# Patient Record
Sex: Female | Born: 1975 | Race: Black or African American | Hispanic: No | Marital: Single | Smoking: Former smoker
Health system: Southern US, Community
[De-identification: ages and names within clinical notes are randomized; demographics above are authoritative.]

## PROBLEM LIST (undated history)

## (undated) HISTORY — PX: BREAST ENHANCEMENT SURGERY: SHX7

---

## 2005-01-31 ENCOUNTER — Ambulatory Visit: Payer: Self-pay | Admitting: Family Medicine

## 2005-02-21 ENCOUNTER — Ambulatory Visit: Payer: Self-pay | Admitting: *Deleted

## 2005-02-26 ENCOUNTER — Ambulatory Visit (HOSPITAL_COMMUNITY): Admission: RE | Admit: 2005-02-26 | Discharge: 2005-02-26 | Payer: Self-pay | Admitting: *Deleted

## 2005-03-22 ENCOUNTER — Ambulatory Visit: Payer: Self-pay | Admitting: Obstetrics & Gynecology

## 2006-10-07 HISTORY — PX: AUGMENTATION MAMMAPLASTY: SUR837

## 2010-01-21 ENCOUNTER — Emergency Department: Payer: Self-pay | Admitting: Emergency Medicine

## 2010-08-09 ENCOUNTER — Emergency Department: Payer: Self-pay | Admitting: Emergency Medicine

## 2010-12-04 ENCOUNTER — Ambulatory Visit: Payer: Self-pay | Admitting: Specialist

## 2011-02-08 ENCOUNTER — Ambulatory Visit: Payer: Self-pay | Admitting: Neurology

## 2011-07-11 ENCOUNTER — Emergency Department: Payer: Self-pay | Admitting: Internal Medicine

## 2016-09-04 ENCOUNTER — Encounter (HOSPITAL_COMMUNITY): Payer: Self-pay | Admitting: Emergency Medicine

## 2016-09-04 ENCOUNTER — Emergency Department (HOSPITAL_COMMUNITY)
Admission: EM | Admit: 2016-09-04 | Discharge: 2016-09-04 | Disposition: A | Discharge status: Home / Self Care | Payer: Self-pay | Attending: Emergency Medicine | Admitting: Emergency Medicine

## 2016-09-04 DIAGNOSIS — M5442 Lumbago with sciatica, left side: Secondary | ICD-10-CM | POA: Insufficient documentation

## 2016-09-04 MED ORDER — LIDOCAINE 5 % EX PTCH: 1.0000 | MEDICATED_PATCH | CUTANEOUS | 0 refills | Status: DC

## 2016-09-04 MED ORDER — MELOXICAM 7.5 MG PO TABS: 7.5000 mg | ORAL_TABLET | Freq: Every day | ORAL | 0 refills | Status: AC

## 2016-09-04 MED ORDER — CYCLOBENZAPRINE HCL 10 MG PO TABS: 10.0000 mg | ORAL_TABLET | Freq: Three times a day (TID) | ORAL | 0 refills | Status: DC | PRN

## 2016-09-04 NOTE — Discharge Instructions (Signed)
Read the information below.  Use the prescribed medication as directed.  Please discuss all new medications with your pharmacist.  You may return to the Emergency Department at any time for worsening condition or any new symptoms that concern you.     If you develop fevers, loss of control of bowel or bladder, weakness or numbness in your legs, or are unable to walk, return to the ER for a recheck.   Do not take other NSAIDs (e.g. Motrin, aleve, ibuprofen, Advil) while taking Meloxicam (Mobic).

## 2016-09-04 NOTE — ED Triage Notes (Signed)
Patient reports lower back pain radiating down left leg x4 days. Denies numbness, tingling, and bowel or bladder changes. Ambulatory to triage.

## 2016-09-04 NOTE — ED Provider Notes (Signed)
[facility]-EMERGENCY DEPT Provider Note   CSN: [id number] Arrival date & time: 09/04/16  2039     History   Chief Complaint Chief Complaint  Patient presents with  . Back Pain    HPI Jacqueline Horn is a 40 y.o. female.  HPI   Patient presents with left lower back pain that began 3 days ago.  The pain initially involved her entire lower back.  The next day she had pain only on the left side.  It is an intense pain that occasionally shoots down the back of her left leg to the level of her knee.  She does do lifting and bending at work [employer] but denies any specific injury.  Took motrin for pain with temporary relief.  Denies fevers, chills, abdominal pain, loss of control of bowel or bladder, weakness of numbness of the extremities, saddle anesthesia, bowel, urinary, or vaginal complaints.     History reviewed. No pertinent past medical history.  There are no active problems to display for this patient.   History reviewed. No pertinent surgical history.  OB History    No data available       Home Medications    Prior to Admission medications   Medication Sig Start Date End Date Taking? Authorizing Provider  cyclobenzaprine (FLEXERIL) 10 MG tablet Take 1 tablet (10 mg total) by mouth 3 (three) times daily as needed for muscle spasms (and pain). 09/04/16   Trixie DredgeEmily Taygen Newsome, PA-C  lidocaine (LIDODERM) 5 % Place 1 patch onto the skin daily. Remove & Discard patch within 12 hours or as directed by MD 09/04/16   Trixie DredgeEmily Dashton Czerwinski, PA-C  meloxicam (MOBIC) 7.5 MG tablet Take 1 tablet (7.5 mg total) by mouth daily. 09/04/16   Trixie DredgeEmily Miho Monda, PA-C    Family History History reviewed. No pertinent family history.  Social History Social History  Substance Use Topics  . Smoking status: Never Smoker  . Smokeless tobacco: Never Used  . Alcohol use Not on file     Allergies   Patient has no known allergies.   Review of Systems Review of Systems  Constitutional: Negative for fever.    Gastrointestinal: Negative for abdominal pain, diarrhea, nausea and vomiting.  Genitourinary: Negative for dysuria, frequency, urgency, vaginal bleeding and vaginal discharge.  Musculoskeletal: Positive for back pain and gait problem.  Skin: Negative for rash.  Allergic/Immunologic: Negative for immunocompromised state.  Neurological: Negative for weakness and numbness.  Psychiatric/Behavioral: Negative for self-injury.     Physical Exam Updated Vital Signs BP 138/98 (BP Location: Left Arm)   Pulse 97   Temp 98.6 F (37 C) (Oral)   Resp 18   Ht 5\' 7"  (1.702 m)   Wt 68 kg   LMP 09/04/2016   SpO2 95%   BMI 23.49 kg/m   Physical Exam  Constitutional: She appears well-developed and well-nourished. No distress.  HENT:  Head: Normocephalic and atraumatic.  Neck: Neck supple.  Pulmonary/Chest: Effort normal.  Abdominal: Soft. She exhibits no distension and no mass. There is no tenderness. There is no rebound and no guarding.  Musculoskeletal:       Back:  Spine nontender, no crepitus, or stepoffs. Lower extremities:  Strength 5/5, sensation intact, distal pulses intact.     Neurological: She is alert.  Skin: She is not diaphoretic.  Nursing note and vitals reviewed.    ED Treatments / Results  Labs (all labs ordered are listed, but only abnormal results are displayed) Labs Reviewed - No data to display  EKG  EKG Interpretation None       Radiology No results found.  Procedures Procedures (including critical care time)  Medications Ordered in ED Medications - No data to display   Initial Impression / Assessment and Plan / ED Course  I have reviewed the triage vital signs and the nursing notes.  Pertinent labs & imaging results that were available during my care of the patient were reviewed by me and considered in my medical decision making (see chart for details).  Clinical Course    Afebrile, nontoxic patient with atraumatic low back pain with  sciatica. No red flags. D/C home with symptomatic medications.  PCP follow up.  Discussed result, findings, treatment, and follow up  with patient.  Pt given return precautions.  Pt verbalizes understanding and agrees with plan.      Final Clinical Impressions(s) / ED Diagnoses   Final diagnoses:  Acute left-sided low back pain with left-sided sciatica    New Prescriptions Discharge Medication List as of 09/04/2016  9:32 PM    START taking these medications   Details  cyclobenzaprine (FLEXERIL) 10 MG tablet Take 1 tablet (10 mg total) by mouth 3 (three) times daily as needed for muscle spasms (and pain)., Starting Wed 09/04/2016, Print    lidocaine (LIDODERM) 5 % Place 1 patch onto the skin daily. Remove & Discard patch within 12 hours or as directed by MD, Starting Wed 09/04/2016, Print    meloxicam (MOBIC) 7.5 MG tablet Take 1 tablet (7.5 mg total) by mouth daily., Starting Wed 09/04/2016, Print         [place]neau BeachEmily Jovon Streetman, PA-C 09/04/16 2222    Benjiman CoreNathan Pickering, MD 09/04/16 [phone]

## 2016-09-25 ENCOUNTER — Encounter (HOSPITAL_COMMUNITY): Payer: Self-pay | Admitting: Emergency Medicine

## 2016-09-25 ENCOUNTER — Emergency Department (HOSPITAL_COMMUNITY): Payer: Self-pay

## 2016-09-25 ENCOUNTER — Emergency Department (HOSPITAL_COMMUNITY)
Admission: EM | Admit: 2016-09-25 | Discharge: 2016-09-25 | Disposition: A | Discharge status: Home / Self Care | Payer: Self-pay | Attending: Emergency Medicine | Admitting: Emergency Medicine

## 2016-09-25 DIAGNOSIS — Y9389 Activity, other specified: Secondary | ICD-10-CM | POA: Insufficient documentation

## 2016-09-25 DIAGNOSIS — M545 Low back pain, unspecified: Secondary | ICD-10-CM

## 2016-09-25 DIAGNOSIS — Z79899 Other long term (current) drug therapy: Secondary | ICD-10-CM | POA: Insufficient documentation

## 2016-09-25 DIAGNOSIS — X500XXA Overexertion from strenuous movement or load, initial encounter: Secondary | ICD-10-CM | POA: Insufficient documentation

## 2016-09-25 DIAGNOSIS — Y990 Civilian activity done for income or pay: Secondary | ICD-10-CM | POA: Insufficient documentation

## 2016-09-25 DIAGNOSIS — Y929 Unspecified place or not applicable: Secondary | ICD-10-CM | POA: Insufficient documentation

## 2016-09-25 LAB — POC URINE PREG, ED: Preg Test, Ur: NEGATIVE

## 2016-09-25 MED ORDER — IBUPROFEN 200 MG PO TABS
600.0000 mg | ORAL_TABLET | Freq: Once | ORAL | Status: AC
  Administered 2016-09-25: 600 mg via ORAL
  Filled 2016-09-25: qty 3

## 2016-09-25 MED ORDER — METHOCARBAMOL 500 MG PO TABS: 500.0000 mg | ORAL_TABLET | Freq: Two times a day (BID) | ORAL | 0 refills | Status: DC

## 2016-09-25 MED ORDER — TRAMADOL HCL 50 MG PO TABS: 50.0000 mg | ORAL_TABLET | Freq: Four times a day (QID) | ORAL | 0 refills | Status: DC | PRN

## 2016-09-25 MED ORDER — NAPROXEN 500 MG PO TABS: 500.0000 mg | ORAL_TABLET | Freq: Two times a day (BID) | ORAL | 0 refills | Status: DC

## 2016-09-25 NOTE — ED Provider Notes (Signed)
[facility]-EMERGENCY DEPT Provider Note   CSN: [id number] Arrival date & time: 09/25/16  1501  By signing my name below, I, Teofilo PodMatthew P. Jamison, attest that this documentation has been prepared under the direction and in the presence of Melburn HakeNicole Demetrious Rainford, [place]-C. Electronically Signed: Teofilo PodMatthew P. Jamison, ED Scribe. 09/25/2016. 3:50 PM.   History   Chief Complaint No chief complaint on file.   The history is provided by the patient. No language interpreter was used.   HPI Comments:  Jacqueline Horn is a 40 y.o. female who presents to the Emergency Department complaining of constant back pain x 3 weeks. Pt reports that she initially injured her back bending and lifting heavy objects repetitively at work. Pt states that she has been limping since sustaining an injury. Pt was seen on 09/04/16 for the back injury with pain radiating down her left leg, and pt was told that she cannot return to work because her pain has persisted. Pt states that the pain is exacerbated by sitting, standing and bending, but is alleviated by walking. She reports her back pain no longer radiates down her leg but just stays localized to her back. Pt states that her work is requiring her to get reevaluated with xray. Pt took pain medication at the ED with significant relief, and has been taking Advil since with mild relief. Pt denies hx of cancer, hx if IV drug use, and denies any other recent injury/trauma. Denies fever, chest pain, SOB, vomiting, bladder incontinence, numbness, weakness. Denies any other recent fall or injuries.  History reviewed. No pertinent past medical history.  There are no active problems to display for this patient.   History reviewed. No pertinent surgical history.  OB History    No data available       Home Medications    Prior to Admission medications   Medication Sig Start Date End Date Taking? Authorizing Provider  cyclobenzaprine (FLEXERIL) 10 MG tablet Take 1 tablet (10 mg total) by  mouth 3 (three) times daily as needed for muscle spasms (and pain). 09/04/16   Trixie DredgeEmily West, PA-C  lidocaine (LIDODERM) 5 % Place 1 patch onto the skin daily. Remove & Discard patch within 12 hours or as directed by MD 09/04/16   Trixie DredgeEmily West, PA-C  meloxicam (MOBIC) 7.5 MG tablet Take 1 tablet (7.5 mg total) by mouth daily. 09/04/16   Trixie DredgeEmily West, PA-C  methocarbamol (ROBAXIN) 500 MG tablet Take 1 tablet (500 mg total) by mouth 2 (two) times daily. 09/25/16   Barrett HenleNicole Elizabeth Sayyid Harewood, PA-C  naproxen (NAPROSYN) 500 MG tablet Take 1 tablet (500 mg total) by mouth 2 (two) times daily. 09/25/16   Barrett HenleNicole Elizabeth Stephani Janak, PA-C  traMADol (ULTRAM) 50 MG tablet Take 1 tablet (50 mg total) by mouth every 6 (six) hours as needed. 09/25/16   Barrett HenleNicole Elizabeth Jenissa Tyrell, PA-C    Family History No family history on file.  Social History Social History  Substance Use Topics  . Smoking status: Never Smoker  . Smokeless tobacco: Never Used  . Alcohol use Not on file     Allergies   Patient has no known allergies.   Review of Systems Review of Systems  Constitutional: Negative for fever.  Respiratory: Negative for shortness of breath.   Cardiovascular: Negative for chest pain.  Gastrointestinal: Negative for vomiting.  Musculoskeletal: Positive for back pain.  Neurological: Negative for weakness and numbness.     Physical Exam Updated Vital Signs BP 116/79 (BP Location: Left Leg)   Pulse 66  Temp 98.1 F (36.7 C) (Oral)   Resp 18   LMP 09/04/2016 Comment: preg test waiver signed  SpO2 100%   Physical Exam  Constitutional: She is oriented to person, place, and time. She appears well-developed and well-nourished.  HENT:  Head: Normocephalic and atraumatic.  Eyes: Conjunctivae and EOM are normal. Right eye exhibits no discharge. Left eye exhibits no discharge. No scleral icterus.  Neck: Normal range of motion. Neck supple.  Cardiovascular: Normal rate, regular rhythm, normal heart sounds and  intact distal pulses.   Pulmonary/Chest: Effort normal. No respiratory distress. She has no wheezes. She has no rales. She exhibits no tenderness.  Abdominal: Soft. Bowel sounds are normal. There is no tenderness.  Musculoskeletal: Normal range of motion. She exhibits no edema, tenderness or deformity.  No midline C, T tenderness. Midline lumbar and left paraspinal muscles. Full range of motion of neck and back. Full range of motion of bilateral upper and lower extremities, with 5/5 strength. Sensation intact. 2+ radial and PT pulses. Cap refill <2 seconds. Patient able to stand and ambulate without assistance.    Neurological: She is alert and oriented to person, place, and time. She has normal strength. She displays normal reflexes. No sensory deficit.  Skin: Skin is warm and dry.  Nursing note and vitals reviewed.    ED Treatments / Results  DIAGNOSTIC STUDIES:  Oxygen Saturation is 100% on RA, normal by my interpretation.    COORDINATION OF CARE:  3:50 PM Discussed treatment plan with pt at bedside and pt agreed to plan.   Labs (all labs ordered are listed, but only abnormal results are displayed) Labs Reviewed  POC URINE PREG, ED    EKG  EKG Interpretation None       Radiology Dg Lumbar Spine Complete  Result Date: 09/25/2016 CLINICAL DATA:  Low back pain radiating down LEFT leg for 3 weeks, repetitive movement work bending down to stop child's EXAM: LUMBAR SPINE - COMPLETE 4+ VIEW COMPARISON:  None FINDINGS: Five non-rib-bearing lumbar vertebra. Bones appear mildly demineralized. Vertebral body and disc space heights maintained. No acute fracture, subluxation or bone destruction. No spondylolysis. Areas of sclerosis within LEFT iliac bone, small focus on RIGHT as well, nonspecific and of uncertain etiology. SI joints symmetric. IMPRESSION: No acute osseous abnormalities. 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  Significance of these is uncertain ; does patient have a history of prior pelvic trauma? Correlation with patient history recommended. If patient has any prior outside exams these would be of benefit in establishing stability of these findings. In the absence of prior studies, consider followup radionuclide bone scan determine if these represent metabolically active sites of potential pathology. Electronically Signed   By: Ulyses Southward M.D.   On: 09/25/2016 17:51    Procedures Procedures (including critical care time)  Medications Ordered in ED Medications  ibuprofen (ADVIL,MOTRIN) tablet 600 mg (600 mg Oral Given 09/25/16 1612)     Initial Impression / Assessment and Plan / ED Course  I have reviewed the triage vital signs and the nursing notes.  Pertinent labs & imaging results that were available during my care of the patient were reviewed by me and considered in my medical decision making (see chart for details).  Clinical Course     Patient with back pain for the past 2-3 weeks after lifting repetitively at work. Pt's work's is requiring her to get re-evaluated prior to returning. Reports back pain has mildly  improved. VSS. Exam revealed TTP of lumbar midline and left lumbar paraspinal muscles.  No neurological deficits and normal neuro exam.  Patient can walk but states is painful.  No loss of bowel or bladder control.  No concern for cauda equina.  No fever, night sweats, weight loss, h/o cancer, IVDU.  Lumbar spine xray showed areas of sclerosis within iliac bones bilaterally, L>R. Pt denies hx of pelvic trauma. No prior films to compare findings. Discussed findings with pt. Advised her to follow up with PCP for further evaluation of findings. Pt given print out of xray results at d/c.  RICE protocol and pain medicine indicated and discussed with patient regarding back pain.  Discussed return precautions.  Final Clinical Impressions(s) / ED Diagnoses   Final diagnoses:  Acute midline  low back pain without sciatica    New Prescriptions Discharge Medication List as of 09/25/2016  6:02 PM    START taking these medications   Details  methocarbamol (ROBAXIN) 500 MG tablet Take 1 tablet (500 mg total) by mouth 2 (two) times daily., Starting Wed 09/25/2016, Print    naproxen (NAPROSYN) 500 MG tablet Take 1 tablet (500 mg total) by mouth 2 (two) times daily., Starting Wed 09/25/2016, Print    traMADol (ULTRAM) 50 MG tablet Take 1 tablet (50 mg total) by mouth every 6 (six) hours as needed., Starting Wed 09/25/2016, Print      I personally performed the services described in this documentation, which was scribed in my presence. The recorded information has been reviewed and is accurate.     Satira Sarkicole Elizabeth West Salem[place]adeau, [place]-C 09/26/16 [phone]    Lorre NickAnthony Allen, MD 09/29/16 [phone]

## 2016-09-25 NOTE — ED Triage Notes (Signed)
Pt reports hurting back at work 3 weeks ago. Pt has not been working since and does not want to go back until she knows she is not permanently injured. States her work is requiring her to get reevaluated with X-rays.

## 2016-09-25 NOTE — Discharge Instructions (Signed)
Take your medications as prescribed as needed for pain relief. I also recommend applying ice and/or heat to affected area for 15-20 minutes 3-4 times daily for additional pain relief. I recommend refrain from doing any heavy lifting, squatting or repetitive movements that exacerbate her symptoms for the next few days. I recommend following up with a primary care provider office listed below if her symptoms have not improved in the next week. I also recommend scheduling a follow-up appointment regarding further evaluation of your new x-ray findings regarding your SI joint. Please return to the Emergency Department if symptoms worsen or new onset of fever, numbness, tingling, groin anesthesia, loss of bowel or bladder, weakness.

## 2016-10-10 ENCOUNTER — Encounter (HOSPITAL_COMMUNITY): Payer: Self-pay | Admitting: Emergency Medicine

## 2016-10-10 ENCOUNTER — Emergency Department (HOSPITAL_COMMUNITY)
Admission: EM | Admit: 2016-10-10 | Discharge: 2016-10-11 | Disposition: A | Discharge status: Other Acute Inpatient Hospital | Payer: Federal, State, Local not specified - Other | Attending: Emergency Medicine | Admitting: Emergency Medicine

## 2016-10-10 DIAGNOSIS — F322 Major depressive disorder, single episode, severe without psychotic features: Secondary | ICD-10-CM | POA: Diagnosis present

## 2016-10-10 DIAGNOSIS — Z87891 Personal history of nicotine dependence: Secondary | ICD-10-CM | POA: Insufficient documentation

## 2016-10-10 DIAGNOSIS — Z79899 Other long term (current) drug therapy: Secondary | ICD-10-CM | POA: Insufficient documentation

## 2016-10-10 DIAGNOSIS — F1210 Cannabis abuse, uncomplicated: Secondary | ICD-10-CM

## 2016-10-10 DIAGNOSIS — Z91040 Latex allergy status: Secondary | ICD-10-CM | POA: Insufficient documentation

## 2016-10-10 LAB — CBC WITH DIFFERENTIAL/PLATELET
BASOS ABS: 0 10*3/uL (ref 0.0–0.1)
BASOS PCT: 0 %
Eosinophils Absolute: 0.1 10*3/uL (ref 0.0–0.7)
Eosinophils Relative: 0 %
HEMATOCRIT: 36 % (ref 36.0–46.0)
Hemoglobin: 12.2 g/dL (ref 12.0–15.0)
LYMPHS PCT: 20 %
Lymphs Abs: 2.6 10*3/uL (ref 0.7–4.0)
MCH: 27.4 pg (ref 26.0–34.0)
MCHC: 33.9 g/dL (ref 30.0–36.0)
MCV: 80.9 fL (ref 78.0–100.0)
Monocytes Absolute: 0.9 10*3/uL (ref 0.1–1.0)
Monocytes Relative: 7 %
NEUTROS ABS: 9.3 10*3/uL — AB (ref 1.7–7.7)
NEUTROS PCT: 73 %
Platelets: 353 10*3/uL (ref 150–400)
RBC: 4.45 MIL/uL (ref 3.87–5.11)
RDW: 15.8 % — ABNORMAL HIGH (ref 11.5–15.5)
WBC: 12.9 10*3/uL — AB (ref 4.0–10.5)

## 2016-10-10 LAB — COMPREHENSIVE METABOLIC PANEL
ALT: 67 U/L — ABNORMAL HIGH (ref 14–54)
ANION GAP: 17 — AB (ref 5–15)
AST: 151 U/L — AB (ref 15–41)
Albumin: 4.3 g/dL (ref 3.5–5.0)
Alkaline Phosphatase: 79 U/L (ref 38–126)
BUN: 10 mg/dL (ref 6–20)
CHLORIDE: 99 mmol/L — AB (ref 101–111)
CO2: 19 mmol/L — ABNORMAL LOW (ref 22–32)
Calcium: 8.9 mg/dL (ref 8.9–10.3)
Creatinine, Ser: 0.76 mg/dL (ref 0.44–1.00)
Glucose, Bld: 85 mg/dL (ref 65–99)
POTASSIUM: 2.4 mmol/L — AB (ref 3.5–5.1)
Sodium: 135 mmol/L (ref 135–145)
TOTAL PROTEIN: 7.6 g/dL (ref 6.5–8.1)
Total Bilirubin: 0.4 mg/dL (ref 0.3–1.2)

## 2016-10-10 LAB — RAPID URINE DRUG SCREEN, HOSP PERFORMED
AMPHETAMINES: NOT DETECTED
BARBITURATES: NOT DETECTED
BENZODIAZEPINES: NOT DETECTED
COCAINE: NOT DETECTED
Opiates: NOT DETECTED
Tetrahydrocannabinol: POSITIVE — AB

## 2016-10-10 LAB — BASIC METABOLIC PANEL
ANION GAP: 9 (ref 5–15)
BUN: 5 mg/dL — ABNORMAL LOW (ref 6–20)
CALCIUM: 8.6 mg/dL — AB (ref 8.9–10.3)
CHLORIDE: 102 mmol/L (ref 101–111)
CO2: 24 mmol/L (ref 22–32)
CREATININE: 0.71 mg/dL (ref 0.44–1.00)
GFR calc non Af Amer: >60 mL/min (ref >60)
GLUCOSE: 105 mg/dL — AB (ref 65–99)
Potassium: 2.6 mmol/L — CL (ref 3.5–5.1)
Sodium: 135 mmol/L (ref 135–145)

## 2016-10-10 LAB — ETHANOL

## 2016-10-10 LAB — POC URINE PREG, ED: PREG TEST UR: NEGATIVE

## 2016-10-10 LAB — MAGNESIUM: Magnesium: 2 mg/dL (ref 1.7–2.4)

## 2016-10-10 MED ORDER — POTASSIUM CHLORIDE CRYS ER 20 MEQ PO TBCR
40.0000 meq | EXTENDED_RELEASE_TABLET | Freq: Two times a day (BID) | ORAL | Status: DC
  Administered 2016-10-11: 40 meq via ORAL
  Filled 2016-10-10: qty 2
  Filled 2016-10-10: qty 4

## 2016-10-10 MED ORDER — LORAZEPAM 1 MG PO TABS
2.0000 mg | ORAL_TABLET | Freq: Once | ORAL | Status: AC
  Administered 2016-10-10: 1 mg via ORAL
  Filled 2016-10-10: qty 2

## 2016-10-10 MED ORDER — POTASSIUM CHLORIDE CRYS ER 20 MEQ PO TBCR
40.0000 meq | EXTENDED_RELEASE_TABLET | Freq: Once | ORAL | Status: AC
  Administered 2016-10-10: 40 meq via ORAL
  Filled 2016-10-10: qty 2

## 2016-10-10 NOTE — ED Notes (Signed)
Patient denies pain and is resting comfortably.  

## 2016-10-10 NOTE — ED Notes (Signed)
Physician at bedside examining patient's feet.

## 2016-10-10 NOTE — Progress Notes (Signed)
Entered in d/c instructions Please use the resources provided to you in emergency room by case manager to assist in your choice of doctor for follow up  These [place] uninsured resources provide possible primary care providers, resources for discounted medications, housing, dental resources, affordable care act information, plus other resources for [place]   

## 2016-10-10 NOTE — ED Notes (Signed)
Pt roaming in hall & into another pt's room stating "I need a cell phone to get on the internet."  Pt redirected to room & informed no internet available, no cell phones available.  Land line offered.  Pt declined.

## 2016-10-10 NOTE — ED Notes (Signed)
Lotion placed on patient's feet due to cracking per MD suggestion.

## 2016-10-10 NOTE — ED Notes (Signed)
Patient had a loud outburst suddenly after being cooperative since her arrival.  She stood up and yelled. "I want to get the f home.  Security notified and charge nurse notified that patient may need to be IVC'd due to being a flight risk.

## 2016-10-10 NOTE — ED Triage Notes (Signed)
[facility].  Patient has been wandering on foot since New Year's Day when she said she smoked a blunt and afterwards became very disoriented, nervous, and not able to be still.  She reports she has been sleeping under bushes and her feet are swollen and blackened.  Patient initially said she didn't have a psychiatric history but then corrected herself and said she had been diagnosed with depression once before.  Patient calm and cooperative now.  No suicidal or homicidal ideation.

## 2016-10-10 NOTE — ED Notes (Signed)
Bed: WA29 Expected date:  Expected time:  Means of arrival:  Comments: [employer] w/ IVC

## 2016-10-10 NOTE — ED Notes (Signed)
Dr. Madilyn Hookees notified of patient's potassium level.
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
## 2016-10-10 NOTE — ED Provider Notes (Signed)
[facility]-EMERGENCY DEPT Provider Note   CSN: [id number] Arrival date & time: 10/10/16  1327     History   Chief Complaint Chief Complaint  Patient presents with  . Hallucinations    HPI Jacqueline Horn is a 41 y.o. female.  The history is provided by the patient. No language interpreter was used.   Jacqueline Horn is a 41 y.o. female who presents to the Emergency Department complaining of psychiatric evaluation.  She has been walking outside in the cold for the last 5 days. She comes into the emergency department because she has thoughts of harming herself. She does not endorse any specific plan. She states that her feet have been hurting since walking outside. She denies any medical problems. Symptoms are moderate and constant in nature. She denies any medical problems. History reviewed. No pertinent past medical history.  There are no active problems to display for this patient.   Past Surgical History:  Procedure Laterality Date  . BREAST ENHANCEMENT SURGERY Bilateral   . CESAREAN SECTION      OB History    Gravida Para Term Preterm AB Living   3 3 1          SAB TAB Ectopic Multiple Live Births                   Home Medications    Prior to Admission medications   Medication Sig Start Date End Date Taking? Authorizing Provider  cyclobenzaprine (FLEXERIL) 10 MG tablet Take 1 tablet (10 mg total) by mouth 3 (three) times daily as needed for muscle spasms (and pain). Patient not taking: Reported on 10/10/2016 09/04/16   Trixie Dredge, PA-C  lidocaine (LIDODERM) 5 % Place 1 patch onto the skin daily. Remove & Discard patch within 12 hours or as directed by MD Patient not taking: Reported on 10/10/2016 09/04/16   Trixie Dredge, PA-C  meloxicam (MOBIC) 7.5 MG tablet Take 1 tablet (7.5 mg total) by mouth daily. Patient not taking: Reported on 10/10/2016 09/04/16   Trixie Dredge, PA-C  methocarbamol (ROBAXIN) 500 MG tablet Take 1 tablet (500 mg total) by mouth 2 (two) times  daily. Patient not taking: Reported on 10/10/2016 09/25/16   Barrett Henle, PA-C  naproxen (NAPROSYN) 500 MG tablet Take 1 tablet (500 mg total) by mouth 2 (two) times daily. Patient not taking: Reported on 10/10/2016 09/25/16   Barrett Henle, PA-C  traMADol (ULTRAM) 50 MG tablet Take 1 tablet (50 mg total) by mouth every 6 (six) hours as needed. Patient not taking: Reported on 10/10/2016 09/25/16   Barrett Henle, PA-C    Family History No family history on file.  Social History Social History  Substance Use Topics  . Smoking status: Former Smoker    Types: Cigarettes  . Smokeless tobacco: Never Used  . Alcohol use No     Allergies   Chocolate and Latex   Review of Systems Review of Systems  All other systems reviewed and are negative.    Physical Exam Updated Vital Signs BP 131/92 (BP Location: Right Arm)   Pulse 109   Temp 98.9 F (37.2 C) (Oral)   Resp 18   LMP 09/04/2016 Comment: preg test waiver signed  SpO2 99%   Physical Exam  Constitutional: She is oriented to person, place, and time. She appears well-developed and well-nourished.  HENT:  Head: Normocephalic and atraumatic.  Cardiovascular: Normal rate and regular rhythm.   No murmur heard. Pulmonary/Chest: Effort normal and breath sounds  normal. No respiratory distress.  Abdominal: Soft. There is no tenderness. There is no rebound and no guarding.  Musculoskeletal:  Mild redness and swelling to bilateral feet with mild tenderness. 2+ DP pulses bilaterally. There is dark discoloration to the toes without any tenderness to palpation. She can wiggle all her toes without difficulty. There is erythema and ruptured blister to the base of the second toe on the right foot without any exudate.  Neurological: She is alert and oriented to person, place, and time.  Skin: Skin is warm and dry.  Psychiatric:  Depressed mood and affect  Nursing note and vitals reviewed.    ED Treatments /  Results  Labs (all labs ordered are listed, but only abnormal results are displayed) Labs Reviewed  CBC WITH DIFFERENTIAL/PLATELET - Abnormal; Notable for the following:       Result Value   WBC 12.9 (*)    RDW 15.8 (*)    Neutro Abs 9.3 (*)    All other components within normal limits  COMPREHENSIVE METABOLIC PANEL  ETHANOL  RAPID URINE DRUG SCREEN, HOSP PERFORMED  POC URINE PREG, ED    EKG  EKG Interpretation None       Radiology No results found.  Procedures Procedures (including critical care time)  Medications Ordered in ED Medications - No data to display   Initial Impression / Assessment and Plan / ED Course  I have reviewed the triage vital signs and the nursing notes.  Pertinent labs & imaging results that were available during my care of the patient were reviewed by me and considered in my medical decision making (see chart for details).  Clinical Course     Patient here after walking outside for several days. She has passive SI with no plan. She does seem to be paranoid at times but is easily calmed and redirectable. She is noted to be hypokalemic, this is being replaced orally. She has been medically cleared for psychiatric evaluation and treatment.  Final Clinical Impressions(s) / ED Diagnoses   Final diagnoses:  None    New Prescriptions New Prescriptions   No medications on file     Tilden FossaElizabeth Demita Tobia, MD 10/11/16 [id number]

## 2016-10-10 NOTE — ED Notes (Signed)
Patient getting ready to shower.

## 2016-10-10 NOTE — Progress Notes (Signed)
ED CM left pt uninsured [place] resources in his locker #29 CM spoke with pt who [place]cp.  CM provided written information to assist pt with determining choice for uninsured accepting pcps, discussed the importance of pcp vs EDP services for f/u care, www.needymeds.org, www.goodrx.com, discounted pharmacies and other [place] resources such as [facility] , [facility], affordable care act, financial assistance, uninsured dental services, [place] med assist, DSS and  health department  Provided resources for [place] uninsured accepting pcps like [facility], [facility] [address], [facility], [facility], local urgent care centers, [facility], [facility], general medical clinics, family services of the [place], [facility] urgent care plus others, medication resources, [facility] out patient pharmacies and housing Provided [facility] contact information

## 2016-10-11 ENCOUNTER — Inpatient Hospital Stay (HOSPITAL_COMMUNITY)
Admission: AD | Admit: 2016-10-11 | Discharge: 2016-10-18 | DRG: 885 | Disposition: A | Discharge status: Home / Self Care | Payer: Federal, State, Local not specified - Other | Source: Intra-hospital | Attending: Psychiatry | Admitting: Psychiatry

## 2016-10-11 ENCOUNTER — Encounter (HOSPITAL_COMMUNITY): Payer: Self-pay | Admitting: *Deleted

## 2016-10-11 DIAGNOSIS — Z9889 Other specified postprocedural states: Secondary | ICD-10-CM | POA: Diagnosis not present

## 2016-10-11 DIAGNOSIS — Z87891 Personal history of nicotine dependence: Secondary | ICD-10-CM | POA: Diagnosis not present

## 2016-10-11 DIAGNOSIS — Z818 Family history of other mental and behavioral disorders: Secondary | ICD-10-CM | POA: Diagnosis not present

## 2016-10-11 DIAGNOSIS — F322 Major depressive disorder, single episode, severe without psychotic features: Secondary | ICD-10-CM | POA: Diagnosis not present

## 2016-10-11 DIAGNOSIS — Z91040 Latex allergy status: Secondary | ICD-10-CM | POA: Diagnosis not present

## 2016-10-11 DIAGNOSIS — F411 Generalized anxiety disorder: Secondary | ICD-10-CM | POA: Diagnosis present

## 2016-10-11 DIAGNOSIS — F1210 Cannabis abuse, uncomplicated: Secondary | ICD-10-CM | POA: Diagnosis present

## 2016-10-11 DIAGNOSIS — F19959 Other psychoactive substance use, unspecified with psychoactive substance-induced psychotic disorder, unspecified: Secondary | ICD-10-CM | POA: Diagnosis present

## 2016-10-11 DIAGNOSIS — Z79899 Other long term (current) drug therapy: Secondary | ICD-10-CM

## 2016-10-11 DIAGNOSIS — F333 Major depressive disorder, recurrent, severe with psychotic symptoms: Principal | ICD-10-CM | POA: Diagnosis present

## 2016-10-11 DIAGNOSIS — Z91018 Allergy to other foods: Secondary | ICD-10-CM

## 2016-10-11 MED ORDER — MAGNESIUM HYDROXIDE 400 MG/5ML PO SUSP: 30.0000 mL | Freq: Every day | ORAL | Status: DC | PRN

## 2016-10-11 MED ORDER — MELOXICAM 7.5 MG PO TABS
7.5000 mg | ORAL_TABLET | Freq: Every day | ORAL | Status: DC
  Administered 2016-10-12 – 2016-10-18 (×7): 7.5 mg via ORAL
  Filled 2016-10-11 (×10): qty 1

## 2016-10-11 MED ORDER — FLUOXETINE HCL 10 MG PO CAPS
10.0000 mg | ORAL_CAPSULE | Freq: Every day | ORAL | Status: DC
  Administered 2016-10-11: 10 mg via ORAL
  Filled 2016-10-11: qty 1

## 2016-10-11 MED ORDER — ACETAMINOPHEN 325 MG PO TABS: 650.0000 mg | ORAL_TABLET | Freq: Once | ORAL | Status: DC

## 2016-10-11 MED ORDER — NAPROXEN 500 MG PO TABS
500.0000 mg | ORAL_TABLET | Freq: Two times a day (BID) | ORAL | Status: DC
  Administered 2016-10-11: 500 mg via ORAL
  Filled 2016-10-11 (×5): qty 1

## 2016-10-11 MED ORDER — HYDROXYZINE HCL 25 MG PO TABS
25.0000 mg | ORAL_TABLET | Freq: Three times a day (TID) | ORAL | Status: DC | PRN
  Administered 2016-10-13 – 2016-10-18 (×5): 25 mg via ORAL
  Filled 2016-10-11 (×4): qty 1
  Filled 2016-10-11: qty 10
  Filled 2016-10-11 (×2): qty 1

## 2016-10-11 MED ORDER — CYCLOBENZAPRINE HCL 10 MG PO TABS
10.0000 mg | ORAL_TABLET | Freq: Three times a day (TID) | ORAL | Status: DC | PRN
  Administered 2016-10-11: 10 mg via ORAL
  Filled 2016-10-11: qty 2

## 2016-10-11 MED ORDER — POTASSIUM CHLORIDE CRYS ER 20 MEQ PO TBCR
40.0000 meq | EXTENDED_RELEASE_TABLET | Freq: Two times a day (BID) | ORAL | Status: DC
  Administered 2016-10-11 – 2016-10-13 (×4): 40 meq via ORAL
  Filled 2016-10-11 (×7): qty 2

## 2016-10-11 MED ORDER — LIDOCAINE 5 % EX PTCH
1.0000 | MEDICATED_PATCH | CUTANEOUS | Status: DC
  Administered 2016-10-11: 1 via TRANSDERMAL
  Filled 2016-10-11 (×10): qty 1

## 2016-10-11 MED ORDER — ACETAMINOPHEN 325 MG PO TABS: 650.0000 mg | ORAL_TABLET | Freq: Four times a day (QID) | ORAL | Status: DC | PRN

## 2016-10-11 MED ORDER — TRAZODONE HCL 50 MG PO TABS
50.0000 mg | ORAL_TABLET | Freq: Every evening | ORAL | Status: DC | PRN
  Administered 2016-10-11: 50 mg via ORAL
  Filled 2016-10-11 (×2): qty 1

## 2016-10-11 MED ORDER — ALUM & MAG HYDROXIDE-SIMETH 200-200-20 MG/5ML PO SUSP: 30.0000 mL | ORAL | Status: DC | PRN

## 2016-10-11 MED ORDER — QUETIAPINE FUMARATE 50 MG PO TABS: 50.0000 mg | ORAL_TABLET | Freq: Every day | ORAL | Status: DC

## 2016-10-11 NOTE — Consult Note (Signed)
[place] [facility] Consult   Reason for Consult:  Disoriented, depressed Referring Physician:  EDP Patient Identification: Jacqueline Horn MRN:  [id number] Principal Diagnosis: MDD (major depressive disorder), severe ([place]) Diagnosis:   Patient Active Problem List   Diagnosis Date Noted  . MDD (major depressive disorder), severe ([place]) [F32.2] 10/11/2016    Priority: High  . Cannabis abuse with physiological dependence [F12.10] 10/11/2016    Priority: High    Total Time spent with patient: 45 minutes  Subjective:   Jacqueline Horn is a 41 y.o. female patient admitted with anxiety, confusion.  HPI:  Jacqueline Horn is a 41 y.o. female who reports long history of Cannabis use dating back to age 26 and history of Major depression dating back to 2003 after her mother passed. Patient presents to [facility] for evaluation, she reports that smoked a "loud'' Cannabis on new year eve and after that became disoriented, confused and was walking outside in the cold for the 5 days prior to her presentation. Today, she is tearful, endorsing worsening depression, hopelessness, anxiety, worries, low energy level and lack of motivation. She also reports that she has not been sleeping well for the past month. Patient denies AVH, SI and homicidal thoughts.  Past Psychiatric History: as above  Risk to Self: Suicidal Ideation: No Suicidal Intent: No Is patient at risk for suicide?: No Suicidal Plan?: No Access to Means: No What has been your use of drugs/alcohol within the last 12 months?:  (see SA section) Intentional Self Injurious Behavior: None Risk to Others: Homicidal Ideation: No Thoughts of Harm to Others: No Current Homicidal Intent: No Current Homicidal Plan: No Access to Homicidal Means: No History of harm to others?: No Assessment of Violence: None Noted Does patient have access to weapons?: No Criminal Charges Pending?: No Does patient have a court date: No Prior Inpatient  Therapy: Prior Inpatient Therapy: No Prior Outpatient Therapy: Prior Outpatient Therapy: Yes Prior Therapy Dates: 10 yrs ago Prior Therapy Facilty/Provider(s):  Consulting civil engineer) Reason for Treatment:  (depression/grief) Does patient have an ACCT team?: No Does patient have Intensive In-House Services?  : No Does patient have [facility] services? : No Does patient have P4CC services?: No  Past Medical History: History reviewed. No pertinent past medical history.  Past Surgical History:  Procedure Laterality Date  . BREAST ENHANCEMENT SURGERY Bilateral   . CESAREAN SECTION     Family History: No family history on file. Family Psychiatric  History:  Social History:  History  Alcohol Use No     History  Drug Use  . Types: Marijuana    Social History   Social History  . Marital status: Single    Spouse name: N/A  . Number of children: N/A  . Years of education: N/A   Social History Main Topics  . Smoking status: Former Smoker    Types: Cigarettes  . Smokeless tobacco: Never Used  . Alcohol use No  . Drug use:     Types: Marijuana  . Sexual activity: Yes    Birth control/ protection: None   Other Topics Concern  . None   Social History Narrative  . None   Additional Social History:    Allergies:   Allergies  Allergen Reactions  . Chocolate Itching  . Latex Itching and Rash    Labs:  Results for orders placed or performed during the hospital encounter of 10/10/16 (from the past 48 hour(s))  Comprehensive metabolic panel     Status: Abnormal  Collection Time: 10/10/16  2:10 PM  Result Value Ref Range   Sodium 135 135 - 145 mmol/L   Potassium 2.4 (LL) 3.5 - 5.1 mmol/L    Comment: CRITICAL RESULT CALLED TO, READ BACK BY AND VERIFIED WITH: R.HESSLER RN [id number] A.QUIZON    Chloride 99 (L) 101 - 111 mmol/L   CO2 19 (L) 22 - 32 mmol/L   Glucose, Bld 85 65 - 99 mg/dL   BUN 10 6 - 20 mg/dL   Creatinine, Ser 0.76 0.44 - 1.00 mg/dL   Calcium 8.9 8.9 - 10.3 mg/dL    Total Protein 7.6 6.5 - 8.1 g/dL   Albumin 4.3 3.5 - 5.0 g/dL   AST 151 (H) 15 - 41 U/L   ALT 67 (H) 14 - 54 U/L   Alkaline Phosphatase 79 38 - 126 U/L   Total Bilirubin 0.4 0.3 - 1.2 mg/dL   GFR calc non Af Amer >60 >60 mL/min   GFR calc Af Amer >60 >60 mL/min    Comment: (NOTE) The eGFR has been calculated using the CKD EPI equation. This calculation has not been validated in all clinical situations. eGFR's persistently <60 mL/min signify possible Chronic Kidney Disease.    Anion gap 17 (H) 5 - 15  Ethanol     Status: None   Collection Time: 10/10/16  2:10 PM  Result Value Ref Range   Alcohol, Ethyl (B) <5 <5 mg/dL    Comment:        LOWEST DETECTABLE LIMIT FOR SERUM ALCOHOL IS 5 mg/dL FOR MEDICAL PURPOSES ONLY   CBC with Diff     Status: Abnormal   Collection Time: 10/10/16  2:10 PM  Result Value Ref Range   WBC 12.9 (H) 4.0 - 10.5 K/uL   RBC 4.45 3.87 - 5.11 MIL/uL   Hemoglobin 12.2 12.0 - 15.0 g/dL   HCT 36.0 36.0 - 46.0 %   MCV 80.9 78.0 - 100.0 fL   MCH 27.4 26.0 - 34.0 pg   MCHC 33.9 30.0 - 36.0 g/dL   RDW 15.8 (H) 11.5 - 15.5 %   Platelets 353 150 - 400 K/uL   Neutrophils Relative % 73 %   Neutro Abs 9.3 (H) 1.7 - 7.7 K/uL   Lymphocytes Relative 20 %   Lymphs Abs 2.6 0.7 - 4.0 K/uL   Monocytes Relative 7 %   Monocytes Absolute 0.9 0.1 - 1.0 K/uL   Eosinophils Relative 0 %   Eosinophils Absolute 0.1 0.0 - 0.7 K/uL   Basophils Relative 0 %   Basophils Absolute 0.0 0.0 - 0.1 K/uL  Magnesium     Status: None   Collection Time: 10/10/16  2:10 PM  Result Value Ref Range   Magnesium 2.0 1.7 - 2.4 mg/dL  Urine rapid drug screen (hosp performed)not at [facility]     Status: Abnormal   Collection Time: 10/10/16  3:09 PM  Result Value Ref Range   Opiates NONE DETECTED NONE DETECTED   Cocaine NONE DETECTED NONE DETECTED   Benzodiazepines NONE DETECTED NONE DETECTED   Amphetamines NONE DETECTED NONE DETECTED   Tetrahydrocannabinol POSITIVE (A) NONE DETECTED    Barbiturates NONE DETECTED NONE DETECTED    Comment:        DRUG SCREEN FOR MEDICAL PURPOSES ONLY.  IF CONFIRMATION IS NEEDED FOR ANY PURPOSE, NOTIFY LAB WITHIN 5 DAYS.        LOWEST DETECTABLE LIMITS FOR URINE DRUG SCREEN Drug Class       Cutoff (ng/mL) Amphetamine  1000 Barbiturate      200 Benzodiazepine   237 Tricyclics       628 Opiates          300 Cocaine          300 THC              50   POC urine preg, ED     Status: None   Collection Time: 10/10/16  3:17 PM  Result Value Ref Range   Preg Test, Ur NEGATIVE NEGATIVE    Comment:        THE SENSITIVITY OF THIS METHODOLOGY IS >24 mIU/mL   Basic metabolic panel     Status: Abnormal   Collection Time: 10/10/16 10:32 PM  Result Value Ref Range   Sodium 135 135 - 145 mmol/L   Potassium 2.6 (LL) 3.5 - 5.1 mmol/L    Comment: CRITICAL RESULT CALLED TO, READ BACK BY AND VERIFIED WITH: A DAVY RN 2304 10/10/16 A NAVARRO    Chloride 102 101 - 111 mmol/L   CO2 24 22 - 32 mmol/L   Glucose, Bld 105 (H) 65 - 99 mg/dL   BUN 5 (L) 6 - 20 mg/dL   Creatinine, Ser 0.71 0.44 - 1.00 mg/dL   Calcium 8.6 (L) 8.9 - 10.3 mg/dL   GFR calc non Af Amer >60 >60 mL/min   GFR calc Af Amer >60 >60 mL/min    Comment: (NOTE) The eGFR has been calculated using the CKD EPI equation. This calculation has not been validated in all clinical situations. eGFR's persistently <60 mL/min signify possible Chronic Kidney Disease.    Anion gap 9 5 - 15    Current Facility-Administered Medications  Medication Dose Route Frequency Provider Last Rate Last Dose  . FLUoxetine (PROZAC) capsule 10 mg  10 mg Oral Daily Atlas Kuc, MD      . potassium chloride SA (K-DUR,KLOR-CON) CR tablet 40 mEq  40 mEq Oral BID Quintella Reichert, MD   40 mEq at 10/11/16 1048  . QUEtiapine (SEROQUEL) tablet 50 mg  50 mg Oral QHS Corena Pilgrim, MD       Current Outpatient Prescriptions  Medication Sig Dispense Refill  . cyclobenzaprine (FLEXERIL) 10 MG tablet Take 1  tablet (10 mg total) by mouth 3 (three) times daily as needed for muscle spasms (and pain). (Patient not taking: Reported on 10/10/2016) 20 tablet 0  . lidocaine (LIDODERM) 5 % Place 1 patch onto the skin daily. Remove & Discard patch within 12 hours or as directed by MD (Patient not taking: Reported on 10/10/2016) 30 patch 0  . meloxicam (MOBIC) 7.5 MG tablet Take 1 tablet (7.5 mg total) by mouth daily. (Patient not taking: Reported on 10/10/2016) 14 tablet 0  . methocarbamol (ROBAXIN) 500 MG tablet Take 1 tablet (500 mg total) by mouth 2 (two) times daily. (Patient not taking: Reported on 10/10/2016) 20 tablet 0  . naproxen (NAPROSYN) 500 MG tablet Take 1 tablet (500 mg total) by mouth 2 (two) times daily. (Patient not taking: Reported on 10/10/2016) 30 tablet 0  . traMADol (ULTRAM) 50 MG tablet Take 1 tablet (50 mg total) by mouth every 6 (six) hours as needed. (Patient not taking: Reported on 10/10/2016) 15 tablet 0    Musculoskeletal: Strength & Muscle Tone: within normal limits Gait & Station: normal Patient leans: N/A  Psychiatric Specialty Exam: Physical Exam  Psychiatric: Her speech is normal. Judgment and thought content normal. Her mood appears anxious. She is slowed and withdrawn. Cognition  and memory are normal. She exhibits a depressed mood.    Review of Systems  Constitutional: Positive for malaise/fatigue.  HENT: Negative.   Eyes: Negative.   Respiratory: Negative.   Cardiovascular: Negative.   Gastrointestinal: Negative.   Genitourinary: Negative.   Musculoskeletal: Negative.   Skin: Negative.   Neurological: Positive for weakness.  Endo/Heme/Allergies: Negative.   Psychiatric/Behavioral: Positive for depression and substance abuse. The patient is nervous/anxious.     Blood pressure 142/81, pulse 100, temperature 98.2 F (36.8 C), temperature source Oral, resp. rate 18, last menstrual period 09/04/2016, SpO2 100 %.There is no height or weight on file to calculate BMI.  General  Appearance: Disheveled  Eye Contact:  Minimal  Speech:  Clear and Coherent and Slow  Volume:  Decreased  Mood:  Anxious, Depressed and Hopeless  Affect:  Constricted  Thought Process:  Coherent  Orientation:  Full (Time, Place, and Person)  Thought Content:  Logical  Suicidal Thoughts:  No  Homicidal Thoughts:  No  Memory:  Immediate;   Fair Recent;   Fair Remote;   Poor  Judgement:  Poor  Insight:  Shallow  Psychomotor Activity:  Psychomotor Retardation  Concentration:  Concentration: Fair and Attention Span: Fair  Recall:  [employer] of Knowledge:  Fair  Language:  Good  Akathisia:  No  Handed:  Right  AIMS (if indicated):     Assets:  Communication Skills  ADL's:  Intact  Cognition:  WNL  Sleep:   poor     Treatment Plan Summary: Daily contact with patient to assess and evaluate symptoms and progress in treatment and Medication management  Start Prozac 10 mg daily for depression. Start Seroquel 50 mg qhs for sleep  Disposition: Recommend psychiatric Inpatient admission when medically cleared.  Corena Pilgrim, MD 10/11/2016 10:56 AM

## 2016-10-11 NOTE — ED Notes (Signed)
Pt discharged ambulatory with [facility] transportation.  Pt was reluctant to go and I reminded her that she signed a consent to go.  I also told her that if she didn't want to go that the doctor may IVC her.  She seemed to be confused stating that I made multiple promises I did not keep.  When asked what they were she stated, "looking out a window and making a phone call"  I did tell her multiple times where the phone was located and even helped her find her phone numbers in her Bible.  All belongings were sent with [facility] driver except her Bible in which she was carrying herself.
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
## 2016-10-11 NOTE — ED Notes (Signed)
Pt brought in by [employer], presents for medical clearance.  Pt states she was confused, wandering  Attempting to find treatment for symptoms, and [employer] brought her in for evaluation.  Pt anxious at present, cooperative.  Denies SI, HI.  Pt feeling shaky and tremulous at present.  Monitoring for safety, Q 15 min checks in effect.  Safety check for contraband completed,  No items found.

## 2016-10-11 NOTE — ED Provider Notes (Signed)
I was asked by the nurse to come and evaluate the patient's feet. There is some concern for possible frostbite with the cold weather. On exam patient has some small blisters noted at the MCP of the third digit on bilateral feet. I suspect this is more likely rubbing injury based on the location. There is no issues with the toes. There is no necrotic areas. On discussion with the patient she has been walking longer distances recently.  Tylenol and ibuprofen and elevation if they swell.  PCP follow up in one week.    Melene Planan Shrihan Putt, DO 10/11/16 1146

## 2016-10-11 NOTE — Progress Notes (Signed)
10/11/16 1339:  LRT went to pt room to offer activities, pt was sleep.  Caroll RancherMarjette Raeanne Deschler, LRT/CTRS

## 2016-10-11 NOTE — Progress Notes (Signed)
Pt compliant with medication regimen. Pt endorsing AH, but reports she feels "better." With much encouragement pt went to eat dinner in the cafeteria. Special checks q 15 mins in place for safety. Will continue to monitor.

## 2016-10-11 NOTE — ED Notes (Signed)
After patient left room her prozac was found in her water cup.

## 2016-10-11 NOTE — Tx Team (Signed)
Initial Treatment Plan 10/11/2016 5:52 PM Jacqueline BryantLatrichia T Rodell ZOX:[id number]    PATIENT STRESSORS: Substance abuse Other: Psychosis   PATIENT STRENGTHS: Ability for insight Motivation for treatment/growth Physical Health Religious Affiliation   PATIENT IDENTIFIED PROBLEMS: Psychosis  Substance Abuse  "Start 2018 Facing forward, good future, financial, good health, family and friend relationships"  "Progress in goal that I got for this lifetime"               DISCHARGE CRITERIA:  Ability to meet basic life and health needs Improved stabilization in mood, thinking, and/or behavior Motivation to continue treatment in a less acute level of care Need for constant or close observation no longer present  PRELIMINARY DISCHARGE PLAN: Attend 12-step recovery group Return to previous living arrangement Return to previous work or school arrangements  PATIENT/FAMILY INVOLVEMENT: This treatment plan has been presented to and reviewed with the patient, Jacqueline Horn.  The patient and family have been given the opportunity to ask questions and make suggestions.  Carleene OverlieMiddleton, Arika Mainer P, RN 10/11/2016, 5:52 PM

## 2016-10-11 NOTE — BH Assessment (Signed)
Dr. Jannifer FranklinAkintayo and Malachy Chamberakia Starkes, NP, recommend INPT treatment. Room assignment is 506-2.

## 2016-10-12 DIAGNOSIS — Z9889 Other specified postprocedural states: Secondary | ICD-10-CM

## 2016-10-12 DIAGNOSIS — Z79899 Other long term (current) drug therapy: Secondary | ICD-10-CM

## 2016-10-12 DIAGNOSIS — F1210 Cannabis abuse, uncomplicated: Secondary | ICD-10-CM

## 2016-10-12 DIAGNOSIS — Z91040 Latex allergy status: Secondary | ICD-10-CM

## 2016-10-12 DIAGNOSIS — F322 Major depressive disorder, single episode, severe without psychotic features: Secondary | ICD-10-CM

## 2016-10-12 DIAGNOSIS — Z91018 Allergy to other foods: Secondary | ICD-10-CM

## 2016-10-12 LAB — BASIC METABOLIC PANEL
Anion gap: 6 (ref 5–15)
BUN: 6 mg/dL (ref 6–20)
CHLORIDE: 103 mmol/L (ref 101–111)
CO2: 28 mmol/L (ref 22–32)
CREATININE: 0.72 mg/dL (ref 0.44–1.00)
Calcium: 9.1 mg/dL (ref 8.9–10.3)
GFR calc non Af Amer: >60 mL/min (ref >60)
Glucose, Bld: 129 mg/dL — ABNORMAL HIGH (ref 65–99)
POTASSIUM: 3.3 mmol/L — AB (ref 3.5–5.1)
Sodium: 137 mmol/L (ref 135–145)

## 2016-10-12 LAB — LIPID PANEL
CHOL/HDL RATIO: 3.3 ratio
CHOLESTEROL: 154 mg/dL (ref 0–200)
HDL: 47 mg/dL (ref >40)
LDL Cholesterol: 87 mg/dL (ref 0–99)
TRIGLYCERIDES: 99 mg/dL (ref <150)
VLDL: 20 mg/dL (ref 0–40)

## 2016-10-12 LAB — TSH: TSH: 1.498 u[IU]/mL (ref 0.350–4.500)

## 2016-10-12 NOTE — [facility] Suicide Risk Assessment (Signed)
[facility]   Nursing information obtained from:  Patient Demographic factors:  Living alone Current Mental Status:  NA Loss Factors:  Decrease in vocational status, Decline in physical health, Financial problems / change in socioeconomic status Historical Factors:  Family history of mental illness or substance abuse, Domestic violence Risk Reduction Factors:  NA  Total Time spent with patient: 1.5 hours Principal Problem: <principal problem not specified> Diagnosis:   Patient Active Problem List   Diagnosis Date Noted  . MDD (major depressive disorder), severe ([facility]) [F32.2] 10/11/2016  . Cannabis abuse with physiological dependence [F12.10] 10/11/2016   Subjective Data: Alert oriented still somewhat confused and paranoid. Says not aware of clear memories for last 4 days  Continued Clinical Symptoms:  Alcohol Use Disorder Identification Test Final Score (AUDIT): 0 The "Alcohol Use Disorders Identification Test", Guidelines for Use in Primary Care, Second Edition.  World Science writerHealth Organization [facility](WHO). Score between 0-7:  no or low risk or alcohol related problems. Score between 8-15:  moderate risk of alcohol related problems. Score between 16-19:  high risk of alcohol related problems. Score 20 or above:  warrants further diagnostic evaluation for alcohol dependence and treatment.   CLINICAL FACTORS:   Depression:   Anhedonia Hopelessness Alcohol/Substance Abuse/Dependencies More than one psychiatric diagnosis Unstable or Poor Therapeutic Relationship   Musculoskeletal: Strength & Muscle Tone: within normal limits Gait & Station: normal Patient leans: no lean  Psychiatric Specialty Exam: Physical Exam  HENT:  Head: Normocephalic.    Review of Systems  Cardiovascular: Negative for chest pain.  Gastrointestinal: Negative for nausea.  Psychiatric/Behavioral: The patient is nervous/anxious.     Blood pressure 129/89, pulse (!) 115, temperature 99  F (37.2 C), temperature source Oral, resp. rate 20, height 5\' 7"  (1.702 m), weight 69.4 kg (153 lb), last menstrual period 09/04/2016, SpO2 100 %.Body mass index is 23.96 kg/m.  General Appearance: Disheveled  Eye Contact:  Fair  Speech:  Slow  Volume:  Decreased  Mood:  Anxious  Affect:  Constricted  Thought Process:  Irrelevant  Orientation:  Full (Time, Place, and Person)  Thought Content:  Paranoid Ideation and Rumination  Suicidal Thoughts:  No  Homicidal Thoughts:  No  Memory:  Immediate;   Fair Recent;   Fair  Judgement:  Poor  Insight:  Shallow  Psychomotor Activity:  Normal  Concentration:  Concentration: Fair and Attention Span: Fair  Recall:  [employer]  Fund of Knowledge:  Fair  Language:  Fair  Akathisia:  Negative  Handed:  Right  AIMS (if indicated):     Assets:  Desire for Improvement  ADL's:  Intact  Cognition:  WNL  Sleep:  Number of Hours: 6.75      COGNITIVE FEATURES THAT CONTRIBUTE TO RISK:  Closed-mindedness and Polarized thinking    SUICIDE RISK:   Moderate:  Frequent suicidal ideation with limited intensity, and duration, some specificity in terms of plans, no associated intent, good self-control, limited dysphoria/symptomatology, some risk factors present, and identifiable protective factors, including available and accessible social support.   PLAN OF CARE: Admit for stabilization and medication management. Safety and for sobriety  I certify that inpatient services furnished can reasonably be expected to improve the patient's condition.  Thresa RossAKHTAR, Maguire Sime, MD 10/12/2016, 11:50 AM

## 2016-10-12 NOTE — [facility] Group Notes (Signed)
[facility] Group Notes:  (Clinical Social Work)  10/12/2016  11:15-12:00PM  Summary of Progress/Problems:   Today's process group involved patients discussing their feelings related to being hospitalized, as well as benefits they see to being in the hospital.  These were itemized on the whiteboard, and then the group brainstormed specific ways in which they could seek for those same benefits to happen when they discharge and go back home. The patient expressed a primary feeling about being hospitalized is "ready" but could not express what she is ready for.  Toward the end of group she became somewhat agitated and interrupted what was then being discussed and said she could not tolerate "it" anymore, that she was not sure what was going on, but she could see everybody's sadness and tears in their eyes and wanted it to stop.  She spoke at length about her confusion, but seemed content with CSW's expression that the staff would try our best to help her feel better, less confused, and more able to understand what is going on in her life.  Type of Therapy:  Group Therapy - Process  Participation Level:  Active  Participation Quality:  Attentive  Affect:  Flat and Labile  Cognitive:  Disorganized and Confused and Delusional  Insight:  Limited  Engagement in Therapy:  Improving  Modes of Intervention:  Exploration, Discussion  Ambrose MantleMareida Grossman-Orr, LCSW 10/12/2016, 2:52 PM

## 2016-10-12 NOTE — Progress Notes (Signed)
Adult Psychoeducational Group Note  Date:  10/12/2016 Time:  8:42 PM  Group Topic/Focus:  Wrap-Up Group:   The focus of this group is to help patients review their daily goal of treatment and discuss progress on daily workbooks.   Participation Level:  Active  Participation Quality:  Appropriate  Affect:  Appropriate  Cognitive:  Appropriate  Insight: Appropriate  Engagement in Group:  Engaged  Modes of Intervention:  Discussion  Additional Comments: The patient expressed that she rates today a 5. The patient also said that attended groups. Octavio Mannshigpen, Aleli Navedo Lee 10/12/2016, 8:42 PM

## 2016-10-12 NOTE — Progress Notes (Signed)
Adult Psychoeducational Group Note  Date:  10/12/2016 Time:  2:07 PM  Group Topic/Focus:  Coping With Mental Health Crisis:   The purpose of this group is to help patients identify strategies for coping with mental health crisis.  Group discusses possible causes of crisis and ways to manage them effectively.   Participation Level:  Active  Participation Quality:  Appropriate and Attentive  Affect:  Flat  Cognitive:  Alert and Appropriate  Insight: None  Engagement in Group:  None  Modes of Intervention:  Discussion, Education and Problem-solving  Additional Comments:    Georgeanna Harrisonheresa A Braxston Quinter 10/12/2016, 2:07 PM

## 2016-10-12 NOTE — Progress Notes (Signed)
DAR NOTE: Patient presents with flat affect and depressed mood.  Denies pain, auditory and visual hallucinations.  Described energy level as low and concentration as good.  Rates depression at 2, hopelessness at 2, and anxiety at 7.  Maintained on routine safety checks.  Medications given as prescribed.  Support and encouragement offered as needed.  Attended group and participated.  States goal for today is "getting healthy and going home."  Patient is withdrawn and isolative.  Minimal interaction with staff.

## 2016-10-12 NOTE — Progress Notes (Signed)
Patient ID: Jacqueline Horn, female   DOB: 10-May-1976, 41 y.o.   MRN: [id number]  D: Patient in room talking to roommate on approach. Pt plans to talk to SW about financial assistance to keep apartment. Pt mood and affect appeared depressed and flat. Denies  SI/HI/AVH.No behavioral issues noted.  A: Support and encouragement offered as needed. Medications administered as prescribed.  R: Patient cooperative and appropriate on unit. Will continue to monitor patient for safety and stability.
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
## 2016-10-12 NOTE — Progress Notes (Signed)
D: Pt denies SI/HI/AVH. Pt is pleasant and cooperative. Pt isolates to room, pt avoids people. Pt stated she took some bad mariajuana , and stated she was feeling better.   A: Pt was offered support and encouragement. Pt was given scheduled medications. Pt was encourage to attend groups. Q 15 minute checks were done for safety.   R:Pt attends groups and interacts well with peers and staff. Pt is taking medication. Pt has no complaints.Pt receptive to treatment and safety maintained on unit.

## 2016-10-12 NOTE — B[facility] Counselor (Signed)
Clinical Social Work Note  Because of patient's lability and seeming delusional state during group, Psychosocial Assessment will not be attempted today, will be attempted tomorrow 10/13/16.  Ambrose MantleMareida Grossman-Orr, LCSW 10/12/2016, 2:58 PM

## 2016-10-13 DIAGNOSIS — F411 Generalized anxiety disorder: Secondary | ICD-10-CM

## 2016-10-13 DIAGNOSIS — F333 Major depressive disorder, recurrent, severe with psychotic symptoms: Principal | ICD-10-CM

## 2016-10-13 DIAGNOSIS — Z87891 Personal history of nicotine dependence: Secondary | ICD-10-CM

## 2016-10-13 LAB — HEMOGLOBIN A1C
Hgb A1c MFr Bld: 5.1 % (ref 4.8–5.6)
Mean Plasma Glucose: 100 mg/dL

## 2016-10-13 MED ORDER — BUSPIRONE HCL 15 MG PO TABS
7.5000 mg | ORAL_TABLET | Freq: Two times a day (BID) | ORAL | Status: DC
  Administered 2016-10-13 – 2016-10-17 (×8): 7.5 mg via ORAL
  Filled 2016-10-13 (×11): qty 1

## 2016-10-13 MED ORDER — METHOCARBAMOL 750 MG PO TABS: 750.0000 mg | ORAL_TABLET | Freq: Three times a day (TID) | ORAL | Status: DC | PRN

## 2016-10-13 MED ORDER — SERTRALINE HCL 25 MG PO TABS
25.0000 mg | ORAL_TABLET | Freq: Every day | ORAL | Status: DC
  Administered 2016-10-14: 25 mg via ORAL
  Filled 2016-10-13 (×4): qty 1

## 2016-10-13 NOTE — Progress Notes (Signed)
Pt did not attend group. 

## 2016-10-13 NOTE — Progress Notes (Signed)
[facility] MD Progress Note  10/13/2016 12:35 PM Jacqueline Horn  MRN:  [id number] Subjective: Patient reports "  I am not sure when I came in, but I feel better" Patient reports she has been depressed and is unsure why.  Objective: Jacqueline Horn is awake, alert and oriented *3. Seen resting in dayroom interacting with others. Denies suicidal or homicidal ideation. Denies auditory or visual hallucination and does not appear to be responding to internal stimuli. Patient reports smoking marijuana and think it may have been laced with something.   Patient reports she is medication compliant without mediation side effects.   States her depression 8/10. When asked about her depression patient became tearful and didn't elaborate with feelings. Reports fair appetite and states she is resting well.  Support, encouragement and reassurance was provided.    Principal Problem: MDD (major depressive disorder), recurrent, severe, with psychosis ([place]) Diagnosis:   Patient Active Problem List   Diagnosis Date Noted  . MDD (major depressive disorder), recurrent, severe, with psychosis ([place]) [F33.3] 10/13/2016  . GAD (generalized anxiety disorder) [F41.1] 10/13/2016  . Cannabis abuse [F12.10] 10/11/2016   Total Time spent with patient: 20 minutes  Past Psychiatric History:  Past Medical History: History reviewed. No pertinent past medical history.  Past Surgical History:  Procedure Laterality Date  . BREAST ENHANCEMENT SURGERY Bilateral   . CESAREAN SECTION     Family History: History reviewed. No pertinent family history. Family Psychiatric  History:  Social History:  History  Alcohol Use No     History  Drug Use  . Types: Marijuana    Social History   Social History  . Marital status: Single    Spouse name: N/A  . Number of children: N/A  . Years of education: N/A   Social History Main Topics  . Smoking status: Former Smoker    Types: Cigarettes  . Smokeless tobacco: Never Used  .  Alcohol use No  . Drug use:     Types: Marijuana  . Sexual activity: Yes    Birth control/ protection: None   Other Topics Concern  . None   Social History Narrative  . None   Additional Social History:                         Sleep: Fair  Appetite:  Fair  Current Medications: Current Facility-Administered Medications  Medication Dose Route Frequency Provider Last Rate Last Dose  . acetaminophen (TYLENOL) tablet 650 mg  650 mg Oral Q6H PRN Nanci Pina, FNP      . alum & mag hydroxide-simeth (MAALOX/MYLANTA) 200-200-20 MG/5ML suspension 30 mL  30 mL Oral Q4H PRN Nanci Pina, FNP      . busPIRone (BUSPAR) tablet 7.5 mg  7.5 mg Oral BID Benjamine Mola, FNP      . hydrOXYzine (ATARAX/VISTARIL) tablet 25 mg  25 mg Oral TID PRN Nanci Pina, FNP   25 mg at 10/13/16 0718  . lidocaine (LIDODERM) 5 % 1 patch  1 patch Transdermal Q24H Nanci Pina, FNP   1 patch at 10/11/16 1723  . magnesium hydroxide (MILK OF MAGNESIA) suspension 30 mL  30 mL Oral Daily PRN Nanci Pina, FNP      . meloxicam (MOBIC) tablet 7.5 mg  7.5 mg Oral Daily Nanci Pina, FNP   7.5 mg at 10/13/16 0717  . methocarbamol (ROBAXIN) tablet 750 mg  750 mg Oral Q8H PRN Elyse Jarvis  [place], FNP      . sertraline (ZOLOFT) tablet 25 mg  25 mg Oral Daily Benjamine Mola, FNP      . traZODone (DESYREL) tablet 50 mg  50 mg Oral QHS PRN Nanci Pina, FNP   50 mg at 10/11/16 2117    Lab Results:  Results for orders placed or performed during the hospital encounter of 10/11/16 (from the past 48 hour(s))  Lipid panel     Status: None   Collection Time: 10/12/16  6:44 AM  Result Value Ref Range   Cholesterol 154 0 - 200 mg/dL   Triglycerides 99 <150 mg/dL   HDL 47 >40 mg/dL   Total CHOL/HDL Ratio 3.3 RATIO   VLDL 20 0 - 40 mg/dL   LDL Cholesterol 87 0 - 99 mg/dL    Comment:        Total Cholesterol/HDL:CHD Risk Coronary Heart Disease Risk Table                     Men   Women  1/2 Average Risk    3.4   3.3  Average Risk       5.0   4.4  2 X Average Risk   9.6   7.1  3 X Average Risk  23.4   11.0        Use the calculated Patient Ratio above and the CHD Risk Table to determine the patient's CHD Risk.        ATP III CLASSIFICATION (LDL):  <100     mg/dL   Optimal  100-129  mg/dL   Near or Above                    Optimal  130-159  mg/dL   Borderline  160-189  mg/dL   High  >190     mg/dL   Very High Performed at [facility]   TSH     Status: None   Collection Time: 10/12/16  6:44 AM  Result Value Ref Range   TSH 1.498 0.350 - 4.500 uIU/mL    Comment: Performed by a 3rd Generation assay with a functional sensitivity of <=0.01 uIU/mL. Performed at [facility] metabolic panel     Status: Abnormal   Collection Time: 10/12/16  6:28 PM  Result Value Ref Range   Sodium 137 135 - 145 mmol/L   Potassium 3.3 (L) 3.5 - 5.1 mmol/L   Chloride 103 101 - 111 mmol/L   CO2 28 22 - 32 mmol/L   Glucose, Bld 129 (H) 65 - 99 mg/dL   BUN 6 6 - 20 mg/dL   Creatinine, Ser 0.72 0.44 - 1.00 mg/dL   Calcium 9.1 8.9 - 10.3 mg/dL   GFR calc non Af Amer >60 >60 mL/min   GFR calc Af Amer >60 >60 mL/min    Comment: (NOTE) The eGFR has been calculated using the CKD EPI equation. This calculation has not been validated in all clinical situations. eGFR's persistently <60 mL/min signify possible Chronic Kidney Disease.    Anion gap 6 5 - 15    Comment: Performed at [facility]    Blood Alcohol level:  Lab Results  Component Value Date   [facility] <5 16/04/3709    Metabolic Disorder Labs: No results found for: HGBA1C, MPG No results found for: PROLACTIN Lab Results  Component Value Date   CHOL 154 10/12/2016   TRIG 99 10/12/2016  HDL 47 10/12/2016   CHOLHDL 3.3 10/12/2016   VLDL 20 10/12/2016   LDLCALC 87 10/12/2016    Physical Findings: AIMS: Facial and Oral Movements Muscles of Facial Expression: None, normal Lips and Perioral  Area: None, normal Jaw: None, normal Tongue: None, normal,Extremity Movements Upper (arms, wrists, hands, fingers): None, normal Lower (legs, knees, ankles, toes): None, normal, Trunk Movements Neck, shoulders, hips: None, normal, Overall Severity Severity of abnormal movements (highest score from questions above): None, normal Incapacitation due to abnormal movements: None, normal Patient's awareness of abnormal movements (rate only patient's report): No Awareness, Dental Status Current problems with teeth and/or dentures?: No Does patient usually wear dentures?: No  CIWA:    COWS:     Musculoskeletal: Strength & Muscle Tone: within normal limits Gait & Station: normal Patient leans: N/A  Psychiatric Specialty Exam: Physical Exam  Nursing note and vitals reviewed. Constitutional: She is oriented to person, place, and time.  Cardiovascular: Normal rate.   Neurological: She is alert and oriented to person, place, and time.  Psychiatric: She has a normal mood and affect. Her behavior is normal.    Review of Systems  Psychiatric/Behavioral: Positive for depression. Negative for hallucinations. The patient is nervous/anxious.     Blood pressure 121/84, pulse (!) 106, temperature 98.6 F (37 C), temperature source Oral, resp. rate 20, height 5' 7"  (1.702 m), weight 69.4 kg (153 lb), last menstrual period 09/04/2016, SpO2 100 %.Body mass index is 23.96 kg/m.  General Appearance: Casual and Guarded  Eye Contact:  Fair  Speech:  Clear and Coherent  Volume:  Normal  Mood:  Depressed  Affect:  Congruent, Depressed and Flat  Thought Process:  Coherent  Orientation:  Full (Time, Place, and Person)  Thought Content:  Hallucinations: None  Suicidal Thoughts:  No  Homicidal Thoughts:  No  Memory:  Immediate;   Fair Recent;   Fair Remote;   Fair  Judgement:  Fair  Insight:  Present  Psychomotor Activity:  Normal  Concentration:  Concentration: Fair  Recall:  [employer] of  Knowledge:  Fair  Language:  Good  Akathisia:  No  Handed:  Right  AIMS (if indicated):     Assets:  Communication Skills Desire for Improvement Resilience Social Support  ADL's:  Intact  Cognition:  WNL  Sleep:  Number of Hours: 5.25    I agree with current treatment plan on 01/072018, Patient seen face-to-face for psychiatric evaluation follow-up, chart reviewed. Reviewed the information documented and agree with the treatment plan.  Treatment Plan Summary: Daily contact with patient to assess and evaluate symptoms and progress in treatment and Medication management    Medications: -Zoloft 79m po daily (titrate to 576mif tolerated) depression -Buspar 7.76m276mo bid for anxiety *No psychotic features evident (will hold off on meds for that) -Continue trazodone 54m57m qhs prn insomnia -Discontinue flexeril to avoid cholinergic concerns -Replace it with Robaxin 754mg77mq8h prn back pain/spasm -Continue Lidoderm 5% patch for back -Continue vistaril 276mg 61m6h anxiety  Labs/tests: -Reviewed CBC, CMP, UDS, UA, EKG. +THC, Pt was hypokalemic at 2.6 now 3.3, will recheck tomorrow. Discontinued Kdur as the dose was high. TSH is WNL, UPT neg, UA pending.  EKG pending Will continue to monitor vitals ,medication compliance and treatment side effects while patient is here.  CSW will start working on disposition.  Patient to participate in therapeutic milieu  [facility]/04/2017, 12:35 PM  Agree to notes and plan.

## 2016-10-13 NOTE — Progress Notes (Signed)
Patient has been isolative to self this shift. Patient was noted to be tearful but unable to verbalize what was bothering her.  Patient denies SI, HI, and AVH.   Assess patient for safety, offer medications as prescribed, engage patient in 1:1 staff talks,   Continue to monitor patient as planned.   Patient able to contract for safety.

## 2016-10-13 NOTE — [facility] Group Notes (Signed)
[facility] Group Notes:  (Clinical Social Work)  10/13/2016  11:00AM-12:00PM  Summary of Progress/Problems:  The main focus of today's process group was to listen to a variety of genres of music and to identify that different types of music provoke different responses.    The patient expressed at the beginning of group the overall feeling of "discouraged."  When CSW started to explain that she would meet with writer later in the day for her Psychosocial Assessment to be done, she became irritable and said "that's enough, I can't take the sarcasm any more."  Another patient became upset and left the room at that.  Then after only one song, the only other patient left the room, leaving only this patient.  She asked that the music be turned off and she be allowed to watch a religious TV program, and that was done.  Type of Therapy:  Music Therapy   Participation Level:  Minimal  Participation Quality:  Inattentive and Resistant  Affect:  Flat, Depressed, Anxious  Cognitive:  Confused and Delusional  Insight:  Poor  Engagement in Therapy:  None  Modes of Intervention:   Activity, Exploration  Ambrose MantleMareida Grossman-Orr, LCSW 10/13/2016

## 2016-10-13 NOTE — B[facility] Counselor (Signed)
Clinical Social Work Note  At 8:50AM, pt was approached to do Psychosocial Assessment, was crying and reading her Bible, unable to speak.  During group, when CSW was explaining that we would meet this afternoon for a 1:1 meeting to do Psychosocial Assessment, pt had an outburst accusing people in the room including CSW of being sarcastic and talking disrespectfully with double meanings, and that she was "not going to tolerate it."   In CSW's clinical opinion, it would not be beneficial to do Psychosocial Assessment today.  It will be attempted again tomorrow, 10/15/16.  Ambrose MantleMareida Grossman-Orr, LCSW 10/13/2016, 1:12 PM

## 2016-10-14 LAB — PROLACTIN: Prolactin: 10.8 ng/mL (ref 4.8–23.3)

## 2016-10-14 MED ORDER — SERTRALINE HCL 50 MG PO TABS
50.0000 mg | ORAL_TABLET | Freq: Every day | ORAL | Status: DC
  Administered 2016-10-15 – 2016-10-18 (×4): 50 mg via ORAL
  Filled 2016-10-14 (×6): qty 1
  Filled 2016-10-14: qty 7

## 2016-10-14 NOTE — [facility] Suicide Risk Assessment (Signed)
[facility] INPATIENT:  Family/Significant Other Suicide Prevention Education  Suicide Prevention Education:  Education Completed; No one has been identified by the patient as the family member/significant other with whom the patient will be residing, and identified as the person(s) who will aid the patient in the event of a mental health crisis (suicidal ideations/suicide attempt).  With written consent from the patient, the family member/significant other has been provided the following suicide prevention education, prior to the and/or following the discharge of the patient.  The suicide prevention education provided includes the following:  Suicide risk factors  Suicide prevention and interventions  National Suicide Hotline telephone number  [facility] assessment telephone number  [facility] Emergency Assistance 911  [facility] and/or Residential Mobile Crisis Unit telephone number  Request made of family/significant other to:  Remove weapons (e.g., guns, rifles, knives), all items previously/currently identified as safety concern.    Remove drugs/medications (over-the-counter, prescriptions, illicit drugs), all items previously/currently identified as a safety concern.  The family member/significant other verbalizes understanding of the suicide prevention education information provided.  The family member/significant other agrees to remove the items of safety concern listed above. The patient did not endorse SI at the time of admission, nor did the patient c/o SI during the stay here.  SPE not required.  Ida RogueRodney B Jamieson Lisa 10/14/2016, 8:40 AM

## 2016-10-14 NOTE — Progress Notes (Signed)
   D: Pt was sitting on the floor in her room playing cards with her roommate when the pt entered the room. Writer called pt out in order to assess and offer prn or scheduled meds. Pt decided that she wanted to get sleep med, but changed her mind asking "what time it was". When informed pt stated she thought it was morning, asking to see the writer's watch for verification. Pt informed the writer that previous rn had given her something but informed that it wasn't for sleep. Stated the buspar made her sleepy. Writer informed that one of the side effects is drowsiness, however, it wasn't intended for sleep. Pt continued to ask the date, day and time.  Once back in the room pt informed writer that she wouldn't be needing any medication.  Pt followed the writer to the door, and closed it almost before the writer could completely exit. Pt has no other questions or concerns.   A:  Support and encouragement was offered. 15 min checks continued for safety.  R: Pt remains safe.
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
## 2016-10-14 NOTE — Progress Notes (Signed)
[facility] MD Progress Note  10/14/2016 2:51 PM Jacqueline Horn  MRN:  [id number] Subjective: Patient reports "I feel ok."     Objective: Jacqueline Horn is seen as alert , oriented x3. Pt denies any new concerns . Pt has been tolerating her medications well, denies ADRs. Pt reports her depression is improving. Per staff - denies any disruptive issues -seen as tearful of and on -  continue to need encouragement and support. is awake, a  Principal Problem: MDD (major depressive disorder), recurrent, severe, with psychosis ([place]) Diagnosis:   Patient Active Problem List   Diagnosis Date Noted  . MDD (major depressive disorder), recurrent, severe, with psychosis ([place]) [F33.3] 10/13/2016  . GAD (generalized anxiety disorder) [F41.1] 10/13/2016  . Cannabis abuse [F12.10] 10/11/2016   Total Time spent with patient: 20 minutes  Past Psychiatric History:Please see H&P.   Past Medical History:  Past Surgical History:  Procedure Laterality Date  . BREAST ENHANCEMENT SURGERY Bilateral   . CESAREAN SECTION     Family History: Please see H&P.  Family Psychiatric  History: Please see H&P.  Social History:  History  Alcohol Use No     History  Drug Use  . Types: Marijuana    Social History   Social History  . Marital status: Single    Spouse name: N/A  . Number of children: N/A  . Years of education: N/A   Social History Main Topics  . Smoking status: Former Smoker    Types: Cigarettes  . Smokeless tobacco: Never Used  . Alcohol use No  . Drug use:     Types: Marijuana  . Sexual activity: Yes    Birth control/ protection: None   Other Topics Concern  . None   Social History Narrative  . None   Additional Social History:                         Sleep: Fair  Appetite:  Fair  Current Medications: Current Facility-Administered Medications  Medication Dose Route Frequency Provider Last Rate Last Dose  . acetaminophen (TYLENOL) tablet 650 mg  650 mg Oral  Q6H PRN Nanci Pina, FNP      . alum & mag hydroxide-simeth (MAALOX/MYLANTA) 200-200-20 MG/5ML suspension 30 mL  30 mL Oral Q4H PRN Nanci Pina, FNP      . busPIRone (BUSPAR) tablet 7.5 mg  7.5 mg Oral BID Benjamine Mola, FNP   7.5 mg at 10/14/16 0800  . hydrOXYzine (ATARAX/VISTARIL) tablet 25 mg  25 mg Oral TID PRN Nanci Pina, FNP   25 mg at 10/13/16 0718  . lidocaine (LIDODERM) 5 % 1 patch  1 patch Transdermal Q24H Nanci Pina, FNP   1 patch at 10/11/16 1723  . magnesium hydroxide (MILK OF MAGNESIA) suspension 30 mL  30 mL Oral Daily PRN Nanci Pina, FNP      . meloxicam (MOBIC) tablet 7.5 mg  7.5 mg Oral Daily Nanci Pina, FNP   7.5 mg at 10/14/16 0801  . methocarbamol (ROBAXIN) tablet 750 mg  750 mg Oral Q8H PRN Benjamine Mola, FNP      . sertraline (ZOLOFT) tablet 25 mg  25 mg Oral Daily Benjamine Mola, FNP   25 mg at 10/14/16 0800  . traZODone (DESYREL) tablet 50 mg  50 mg Oral QHS PRN Nanci Pina, FNP   50 mg at 10/11/16 2117    Lab Results:  Results for  orders placed or performed during the hospital encounter of 10/11/16 (from the past 48 hour(s))  Basic metabolic panel     Status: Abnormal   Collection Time: 10/12/16  6:28 PM  Result Value Ref Range   Sodium 137 135 - 145 mmol/L   Potassium 3.3 (L) 3.5 - 5.1 mmol/L   Chloride 103 101 - 111 mmol/L   CO2 28 22 - 32 mmol/L   Glucose, Bld 129 (H) 65 - 99 mg/dL   BUN 6 6 - 20 mg/dL   Creatinine, Ser 0.72 0.44 - 1.00 mg/dL   Calcium 9.1 8.9 - 10.3 mg/dL   GFR calc non Af Amer >60 >60 mL/min   GFR calc Af Amer >60 >60 mL/min    Comment: (NOTE) The eGFR has been calculated using the CKD EPI equation. This calculation has not been validated in all clinical situations. eGFR's persistently <60 mL/min signify possible Chronic Kidney Disease.    Anion gap 6 5 - 15    Comment: Performed at [facility]    Blood Alcohol level:  Lab Results  Component Value Date   [facility] <5 51/76/1607     Metabolic Disorder Labs: Lab Results  Component Value Date   HGBA1C 5.1 10/12/2016   MPG 100 10/12/2016   Lab Results  Component Value Date   PROLACTIN 10.8 10/12/2016   Lab Results  Component Value Date   CHOL 154 10/12/2016   TRIG 99 10/12/2016   HDL 47 10/12/2016   CHOLHDL 3.3 10/12/2016   VLDL 20 10/12/2016   LDLCALC 87 10/12/2016    Physical Findings: AIMS: Facial and Oral Movements Muscles of Facial Expression: None, normal Lips and Perioral Area: None, normal Jaw: None, normal Tongue: None, normal,Extremity Movements Upper (arms, wrists, hands, fingers): None, normal Lower (legs, knees, ankles, toes): None, normal, Trunk Movements Neck, shoulders, hips: None, normal, Overall Severity Severity of abnormal movements (highest score from questions above): None, normal Incapacitation due to abnormal movements: None, normal Patient's awareness of abnormal movements (rate only patient's report): No Awareness, Dental Status Current problems with teeth and/or dentures?: No Does patient usually wear dentures?: No  CIWA:    COWS:     Musculoskeletal: Strength & Muscle Tone: within normal limits Gait & Station: normal Patient leans: N/A  Psychiatric Specialty Exam: Physical Exam  Nursing note and vitals reviewed.   Review of Systems  Psychiatric/Behavioral: Positive for substance abuse. Negative for hallucinations. The patient is nervous/anxious.   All other systems reviewed and are negative.   Blood pressure (!) 132/91, pulse (!) 108, temperature 98.4 F (36.9 C), temperature source Oral, resp. rate 16, height 5' 7"  (1.702 m), weight 69.4 kg (153 lb), last menstrual period 09/04/2016, SpO2 100 %.Body mass index is 23.96 kg/m.  General Appearance: Casual  Eye Contact:  Fair  Speech:  Clear and Coherent  Volume:  Decreased  Mood:  Anxious and Depressed  Affect:  Depressed  Thought Process:  Coherent, Goal Directed and Descriptions of Associations:  Circumstantial  Orientation:  Full (Time, Place, and Person)  Thought Content:  Rumination  Suicidal Thoughts:  No  Homicidal Thoughts:  No  Memory:  Immediate;   Fair Recent;   Fair Remote;   Fair  Judgement:  Fair  Insight:  Present  Psychomotor Activity:  Normal  Concentration:  Concentration: Fair and Attention Span: Fair  Recall:  [employer] of Knowledge:  Fair  Language:  Good  Akathisia:  No  Handed:  Right  AIMS (if indicated):  Assets:  Communication Skills Desire for Improvement Social Support  ADL's:  Intact  Cognition:  WNL  Sleep:  Number of Hours: 5.25   MDD (major depressive disorder), recurrent, severe, with psychosis ([place]) improving  Will continue today 10/14/16  plan as below except where it is noted.   Treatment Plan Summary: Daily contact with patient to assess and evaluate symptoms and progress in treatment and Medication management    Medications: -Will increase Zoloft to 50 mg po daily. -Buspar 7.63m po bid for anxiety *No psychotic features evident (will hold off on meds for that) -Continue trazodone 58mpo qhs prn insomnia -Discontinue flexeril to avoid cholinergic concerns -Replace it with Robaxin 75091mo q8h prn back pain/spasm -Continue Lidoderm 5% patch for back -Continue vistaril 30m13m q6h anxiety - Labs - repeat CMP since K+ was low. Will continue to monitor vitals ,medication compliance and treatment side effects while patient is here.  CSW will continue  working on disposition.  Patient to participate in therapeutic milieu  Atalya Dano, MD 10/14/2016, 2:51 PM

## 2016-10-14 NOTE — B[facility] Group Notes (Signed)
[facility] LCSW Group Therapy  10/14/2016 , 3:12 PM   Type of Therapy:  Group Therapy  Participation Level:  Active  Participation Quality:  Attentive  Affect:  Appropriate  Cognitive:  Alert  Insight:  Improving  Engagement in Therapy:  Engaged  Modes of Intervention:  Discussion, Exploration and Socialization  Summary of Progress/Problems: Today's group focused on the term Diagnosis.  Participants were asked to define the term, and then pronounce whether it is a negative, positive or neutral term.  Invited.  Chose to not attend.  Daryel Geraldorth, Kamyra Schroeck B 10/14/2016 , 3:12 PM

## 2016-10-14 NOTE — Progress Notes (Signed)
Recreation Therapy Notes  Date: 10/14/16 Time: 1000 Location: 500 Hall Dayroom  Group Topic: Coping Skills  Goal Area(s) Addresses:  Patients will be able to identify positive coping skills. Patients will be able to identify the benefits of coping skills. Patients will be able to identify how using coping skills will be helpful post d/c.  Intervention: Dry erase marker, dry erase board, strips of paper with various coping skills   Activity: Coping Skills Pictionary.  Patients were to pick a strip of paper from a can.  Patients were to draw what was on the paper on the board.  The remaining patients were to guess what was being drawn.  The person that guesses the picture, would get the next turn.  Education: Coping Skills, Discharge Planning.   Education Outcome: Acknowledges understanding/In group clarification offered/Needs additional education.   Clinical Observations/Feedback: Pt did not attend group.   Ahonesty Woodfin, LRT/CTRS         Andreah Goheen A 10/14/2016 12:49 PM 

## 2016-10-14 NOTE — Plan of Care (Signed)
Problem: Safety: Goal: Periods of time without injury will increase Outcome: Progressing Q 15 minutes safety checks maintained without self harm gestures to note thus far.  Problem: Health Behavior/Discharge Planning: Goal: Compliance with prescribed medication regimen will improve Outcome: Progressing Pt compliant with current medications as ordered. Denies adverse drug reactions when assessed.

## 2016-10-14 NOTE — Progress Notes (Signed)
D: Pt presents tearful with sullen affect on initial approach this AM. Depressed, anxious and confused. Denies SI, HI, AVH and pain stated "I don't know what [place] has to do to take care of myself and why I'm even here". Pt is isolative, guarded and paranoid. Refused to eat lunch and dinner when offered.  A: A: Medications administered as prescribed. Encouragement and support provided to pt throughout this shift. Urine sample obtained for U/A. Pt informed of CMP lab in AM. Q 15 minutes safety checks maintained  without self harm gestures thus far.  R: Pt compliant with medications when offered. Denies adverse drug reactions. Pt did not attend AM group as scheduled but was present for noon group. POC effective for mood stabilization and safety.

## 2016-10-14 NOTE — [facility] Suicide Risk Assessment (Signed)
[facility] INPATIENT:  Family/Significant Other Suicide Prevention Education  Suicide Prevention Education:  Patient Refusal for Family/Significant Other Suicide Prevention Education: The patient Jacqueline Horn has refused to provide written consent for family/significant other to be provided Family/Significant Other Suicide Prevention Education during admission and/or prior to discharge.  Physician notified.  Patient declined to consent to collateral contact.  SPE reviewed w patient.  Brochure provided.    Sallee Langenne C Zorian Gunderman 10/14/2016, 8:47 AM

## 2016-10-14 NOTE — Tx Team (Signed)
Interdisciplinary Treatment and Diagnostic Plan Update  10/14/2016 Time of Session: 3:13 PM  Jacqueline Horn MRN: [id number]  Principal Diagnosis: MDD (major depressive disorder), recurrent, severe, with psychosis ([place])  Secondary Diagnoses: Principal Problem:   MDD (major depressive disorder), recurrent, severe, with psychosis ([place]) Active Problems:   Cannabis abuse   GAD (generalized anxiety disorder)   Current Medications:  Current Facility-Administered Medications  Medication Dose Route Frequency Provider Last Rate Last Dose  . acetaminophen (TYLENOL) tablet 650 mg  650 mg Oral Q6H PRN Nanci Pina, FNP      . alum & mag hydroxide-simeth (MAALOX/MYLANTA) 200-200-20 MG/5ML suspension 30 mL  30 mL Oral Q4H PRN Nanci Pina, FNP      . busPIRone (BUSPAR) tablet 7.5 mg  7.5 mg Oral BID Benjamine Mola, FNP   7.5 mg at 10/14/16 0800  . hydrOXYzine (ATARAX/VISTARIL) tablet 25 mg  25 mg Oral TID PRN Nanci Pina, FNP   25 mg at 10/13/16 0718  . lidocaine (LIDODERM) 5 % 1 patch  1 patch Transdermal Q24H Nanci Pina, FNP   1 patch at 10/11/16 1723  . magnesium hydroxide (MILK OF MAGNESIA) suspension 30 mL  30 mL Oral Daily PRN Nanci Pina, FNP      . meloxicam (MOBIC) tablet 7.5 mg  7.5 mg Oral Daily Nanci Pina, FNP   7.5 mg at 10/14/16 0801  . methocarbamol (ROBAXIN) tablet 750 mg  750 mg Oral Q8H PRN Benjamine Mola, FNP      . [START ON 10/15/2016] sertraline (ZOLOFT) tablet 50 mg  50 mg Oral Daily Saramma Eappen, MD      . traZODone (DESYREL) tablet 50 mg  50 mg Oral QHS PRN Nanci Pina, FNP   50 mg at 10/11/16 2117    PTA Medications: Prescriptions Prior to Admission  Medication Sig Dispense Refill Last Dose  . cyclobenzaprine (FLEXERIL) 10 MG tablet Take 1 tablet (10 mg total) by mouth 3 (three) times daily as needed for muscle spasms (and pain). (Patient not taking: Reported on 10/10/2016) 20 tablet 0 Not Taking at Unknown time  . lidocaine (LIDODERM) 5 %  Place 1 patch onto the skin daily. Remove & Discard patch within 12 hours or as directed by MD (Patient not taking: Reported on 10/10/2016) 30 patch 0 Not Taking at Unknown time  . meloxicam (MOBIC) 7.5 MG tablet Take 1 tablet (7.5 mg total) by mouth daily. (Patient not taking: Reported on 10/10/2016) 14 tablet 0 Not Taking at Unknown time  . methocarbamol (ROBAXIN) 500 MG tablet Take 1 tablet (500 mg total) by mouth 2 (two) times daily. (Patient not taking: Reported on 10/10/2016) 20 tablet 0 Not Taking at Unknown time  . naproxen (NAPROSYN) 500 MG tablet Take 1 tablet (500 mg total) by mouth 2 (two) times daily. (Patient not taking: Reported on 10/10/2016) 30 tablet 0 Not Taking at Unknown time  . traMADol (ULTRAM) 50 MG tablet Take 1 tablet (50 mg total) by mouth every 6 (six) hours as needed. (Patient not taking: Reported on 10/10/2016) 15 tablet 0 Not Taking at Unknown time    Treatment Modalities: Medication Management, Group therapy, Case management,  1 to 1 session with clinician, Psychoeducation, Recreational therapy.   Physician Treatment Plan for Primary Diagnosis: MDD (major depressive disorder), recurrent, severe, with psychosis ([place]) Long Term Goal(s): Improvement in symptoms so as ready for discharge  Short Term Goals: Ability to identify changes in lifestyle to reduce recurrence of condition  will improve Ability to verbalize feelings will improve Ability to disclose and discuss suicidal ideas Ability to demonstrate self-control will improve Ability to identify and develop effective coping behaviors will improve Ability to maintain clinical measurements within normal limits will improve Compliance with prescribed medications will improve Ability to identify triggers associated with substance abuse/mental health issues will improve   Medication Management: Evaluate patient's response, side effects, and tolerance of medication regimen.  Therapeutic Interventions: 1 to 1 sessions, Unit  Group sessions and Medication administration.  Evaluation of Outcomes: Adequate for Discharge  Physician Treatment Plan for Secondary Diagnosis: Principal Problem:   MDD (major depressive disorder), recurrent, severe, with psychosis ([place]) Active Problems:   Cannabis abuse   GAD (generalized anxiety disorder)   Long Term Goal(s): Improvement in symptoms so as ready for discharge  Short Term Goals: Ability to identify changes in lifestyle to reduce recurrence of condition will improve Ability to verbalize feelings will improve Ability to disclose and discuss suicidal ideas Ability to demonstrate self-control will improve Ability to identify and develop effective coping behaviors will improve Ability to maintain clinical measurements within normal limits will improve Compliance with prescribed medications will improve Ability to identify triggers associated with substance abuse/mental health issues will improve   Medication Management: Evaluate patient's response, side effects, and tolerance of medication regimen.  Therapeutic Interventions: 1 to 1 sessions, Unit Group sessions and Medication administration.  Evaluation of Outcomes: Adequate for Discharge   RN Treatment Plan for Primary Diagnosis: MDD (major depressive disorder), recurrent, severe, with psychosis ([place]) Long Term Goal(s): Knowledge of disease and therapeutic regimen to maintain health will improve  Short Term Goals: Ability to identify and develop effective coping behaviors will improve and Compliance with prescribed medications will improve  Medication Management: RN will administer medications as ordered by provider, will assess and evaluate patient's response and provide education to patient for prescribed medication. RN will report any adverse and/or side effects to prescribing provider.  Therapeutic Interventions: 1 on 1 counseling sessions, Psychoeducation, Medication administration, Evaluate responses to  treatment, Monitor vital signs and CBGs as ordered, Perform/monitor CIWA, COWS, AIMS and Fall Risk screenings as ordered, Perform wound care treatments as ordered.  Evaluation of Outcomes: Adequate for Discharge   LCSW Treatment Plan for Primary Diagnosis: MDD (major depressive disorder), recurrent, severe, with psychosis ([place]) Long Term Goal(s): Safe transition to appropriate next level of care at discharge, Engage patient in therapeutic group addressing interpersonal concerns.  Short Term Goals: Engage patient in aftercare planning with referrals and resources  Therapeutic Interventions: Assess for all discharge needs, 1 to 1 time with Social worker, Explore available resources and support systems, Assess for adequacy in community support network, Educate family and significant other(s) on suicide prevention, Complete Psychosocial Assessment, Interpersonal group therapy.  Evaluation of Outcomes: Met   Progress in Treatment: Attending groups: Yes Participating in groups: Yes Taking medication as prescribed: Yes Toleration medication: Yes, no side effects reported at this time Family/Significant other contact made: No Patient understands diagnosis: Yes AEB asking for help with confusion Discussing patient identified problems/goals with staff: Yes Medical problems stabilized or resolved: Yes Denies suicidal/homicidal ideation: Yes Issues/concerns per patient self-inventory: None Other: N/A  New problem(s) identified: None identified at this time.   New Short Term/Long Term Goal(s): None identified at this time.   Discharge Plan or Barriers: return home, follow up outpt  Reason for Continuation of Hospitalization:  Delusions  Depression Disorganization Medication stabilization   Estimated Length of Stay: Likely d/c tomorrow  Attendees: Patient: 10/14/2016  3:13 PM  Physician: Ursula Alert, MD 10/14/2016  3:13 PM  Nursing: Hoy Register, RN 10/14/2016  3:13 PM  RN Care  Manager: Lars Pinks, RN 10/14/2016  3:13 PM  Social Worker: Ripley Fraise 10/14/2016  3:13 PM  Recreational Therapist: Laretta Bolster  10/14/2016  3:13 PM  Other: Norberto Sorenson 10/14/2016  3:13 PM  Other:  10/14/2016  3:13 PM    Scribe for Treatment Team:  Roque Lias LCSW 10/14/2016 3:13 PM

## 2016-10-15 LAB — URINALYSIS, ROUTINE W REFLEX MICROSCOPIC
BACTERIA UA: NONE SEEN
Bilirubin Urine: NEGATIVE
GLUCOSE, UA: NEGATIVE mg/dL
KETONES UR: 5 mg/dL — AB
LEUKOCYTES UA: NEGATIVE
NITRITE: NEGATIVE
PROTEIN: NEGATIVE mg/dL
Specific Gravity, Urine: 1.013 (ref 1.005–1.030)
pH: 8 (ref 5.0–8.0)

## 2016-10-15 LAB — COMPREHENSIVE METABOLIC PANEL
ALT: 33 U/L (ref 14–54)
AST: 25 U/L (ref 15–41)
Albumin: 4.2 g/dL (ref 3.5–5.0)
Alkaline Phosphatase: 72 U/L (ref 38–126)
Anion gap: 10 (ref 5–15)
BUN: 8 mg/dL (ref 6–20)
CHLORIDE: 105 mmol/L (ref 101–111)
CO2: 22 mmol/L (ref 22–32)
Calcium: 9.3 mg/dL (ref 8.9–10.3)
Creatinine, Ser: 0.67 mg/dL (ref 0.44–1.00)
Glucose, Bld: 126 mg/dL — ABNORMAL HIGH (ref 65–99)
Potassium: 3.8 mmol/L (ref 3.5–5.1)
SODIUM: 137 mmol/L (ref 135–145)
Total Bilirubin: 0.6 mg/dL (ref 0.3–1.2)
Total Protein: 8.2 g/dL — ABNORMAL HIGH (ref 6.5–8.1)

## 2016-10-15 MED ORDER — OLANZAPINE 5 MG PO TBDP
5.0000 mg | ORAL_TABLET | Freq: Three times a day (TID) | ORAL | Status: DC | PRN
  Administered 2016-10-15: 5 mg via ORAL
  Filled 2016-10-15: qty 1

## 2016-10-15 MED ORDER — OLANZAPINE 10 MG IM SOLR: 5.0000 mg | Freq: Three times a day (TID) | INTRAMUSCULAR | Status: DC | PRN

## 2016-10-15 MED ORDER — QUETIAPINE FUMARATE 50 MG PO TABS
50.0000 mg | ORAL_TABLET | Freq: Every day | ORAL | Status: DC
  Administered 2016-10-15: 50 mg via ORAL
  Filled 2016-10-15 (×3): qty 1

## 2016-10-15 MED ORDER — HYDROCERIN EX CREA
TOPICAL_CREAM | Freq: Two times a day (BID) | CUTANEOUS | Status: DC
  Administered 2016-10-15 – 2016-10-18 (×7): via TOPICAL
  Filled 2016-10-15: qty 113

## 2016-10-15 NOTE — Progress Notes (Signed)
Recreation Therapy Notes  Date: 10/15/16 Time: 1000 Location: 500 [address] Dayroom  Group Topic: Self-Esteem  Goal Area(s) Addresses:  Patient will identify positive ways to increase self-esteem. Patient will verbalize benefit of increased self-esteem.  Behavioral Response: Engaged  Intervention: Blank masks, markers  Activity: How I see Me.  Patients were given a worksheet with a blank mask on it.  On the mask, patiens were to draw or write how they feel others see them.  On the back of the sheet, patients were to write or draw how they see themselves.  Education:  Self-Esteem, Building control surveyorDischarge Planning.   Education Outcome: Acknowledges education/In group clarification offered/Needs additional education  Clinical Observations/Feedback: Pt stated others see her as "strong or too strong at times".  Pt also stated there were two sides to her and that some people understand her and some don't.  Pt expressed seeing herself as "strong, loyal, dependable, honest and outspoken".  Pt stated she needs to focus on herself as a whole to improve her situation.   Caroll RancherMarjette Danial Sisley, LRT/CTRS     Caroll RancherLindsay, Ahmoni Edge A 10/15/2016 12:08 PM

## 2016-10-15 NOTE — Progress Notes (Signed)
Patient was noted to be extremely agitated due to hearing people whisper negative things about her.  Patient believes that people are talking about her and looking at her.  Patient states that she always hears voices that are talking about her.  Patient also reported that prior to coming into the hospital she heard people doing the same things.  Patient said while she was in the community she would read her social media page and react to messages that she saw but the messages were not really there.   Assess patient for safety, offer medications as prescribed, engage patient in 1:1 staff talks  Patient had a reduction in agitation and was able to verbalize what she had been feeling.  Patient states she cant keep pushing what is going on down and ignoring it. Patient able to contract for safety.

## 2016-10-15 NOTE — Progress Notes (Signed)
Recreation Therapy Notes  INPATIENT RECREATION THERAPY ASSESSMENT  Patient Details Name: Loreen T Milsap MRN: [id number] DOB: 08/23/1976 Today's Date: 10/15/2016 Gustavus Bryant Patient Stressors:  (Pt stated she doesn't really have stress.)  Pt stated she was here to allow the drugs to detox out of her system.  Coping Skills:   Isolate, Arguments, Avoidance, Exercise, Art/Dance, Talking, Music, Sports  Personal Challenges: Anger, Communication, Expressing Yourself, Relationships, Social Interaction, Stress Management, Trusting Others  Leisure Interests (2+):  Individual - Reading, Individual - Writing, Individual - Other (Comment) (Watch movies, cook)  Awareness of Community Resources:  Yes  Community Resources:  Library, Coffee Shop  Current Use: Yes  Patient Strengths:  Forensic scientistLoyal; Dependable  Patient Identified Areas of Improvement:  Anger Management; Panic attacks  Current Recreation Participation:  Very rarely  Patient Goal for Hospitalization:  "Find out if what was going on was reality and what was going with myself"  [place]  [place]idence[place]  Current SI (including self-harm):  No  Current HI:  No  Consent to Intern Participation: N/A   Caroll RancherMarjette Stepfon Rawles, LRT/CTRS  Caroll RancherLindsay, Sharnette Kitamura A 10/15/2016, 12:30 PM

## 2016-10-15 NOTE — Progress Notes (Signed)
Recreation Therapy Notes  Animal-Assisted Activity (AAA) Program Checklist/Progress Notes Patient Eligibility Criteria Checklist & Daily Group note for Rec Tx Intervention  Date: 01.09.2018 Time: 2:45pm Location: 400 Hall Dayroom    AAA/T Program Assumption of Risk Form signed by Patient/ or Parent Legal Guardian Yes  Patient is free of allergies or sever asthma Yes  Patient reports no fear of animals Yes  Patient reports no history of cruelty to animals Yes  Patient understands his/her participation is voluntary Yes  Patient washes hands before animal contact Yes  Patient washes hands after animal contact Yes  Behavioral Response: Engaged, Appropriate   Education: Hand Washing, Appropriate Animal Interaction   Education Outcome: Acknowledges education.   Clinical Observations/Feedback: Patient discussed with MD for appropriateness in pet therapy session. Both LRT and MD agree patient is appropriate for participation. Patient offered participation in session and signed necessary consent form without issue. Patient attended session for it's entirety, pet therapy dog appropriately and engaged with peers appropriately during time in session.   Dossie Ocanas L Aundra Espin, LRT/CTRS        Jacqueline Horn L 10/15/2016 3:16 PM 

## 2016-10-15 NOTE — Progress Notes (Signed)
[facility] MD Progress Note  10/15/2016 2:00 PM Jacqueline Horn  MRN:  [id number] Subjective: Patient states " I feel I am thinking more clearly now. I do not have any memory of what happened from December last week to now. That's really scary.'      Objective: Jacqueline Horn seen as anxious this AM , worried about how she went through this episode. Pt blames her flexeril and cannabis for her most recent admission. Pt reports she was feeling paranoid and delusional - however she is better now. Pt has been tolerating her medications well. Per RN - pt even though was calm this AM - was seen as paranoid later on , using profanity and acting suspicious - requiring PRN medications. Continue to observe on the unit.    Principal Problem: MDD (major depressive disorder), recurrent, severe, with psychosis ([place]) Diagnosis:   Patient Active Problem List   Diagnosis Date Noted  . MDD (major depressive disorder), recurrent, severe, with psychosis ([place]) [F33.3] 10/13/2016  . GAD (generalized anxiety disorder) [F41.1] 10/13/2016  . Cannabis abuse [F12.10] 10/11/2016   Total Time spent with patient: 25 minutes  Past Psychiatric History:Please see H&P.   Past Medical History:  Past Surgical History:  Procedure Laterality Date  . BREAST ENHANCEMENT SURGERY Bilateral   . CESAREAN SECTION     Family History: Please see H&P.  Family Psychiatric  History: Please see H&P.  Social History:  History  Alcohol Use No     History  Drug Use  . Types: Marijuana    Social History   Social History  . Marital status: Single    Spouse name: N/A  . Number of children: N/A  . Years of education: N/A   Social History Main Topics  . Smoking status: Former Smoker    Types: Cigarettes  . Smokeless tobacco: Never Used  . Alcohol use No  . Drug use:     Types: Marijuana  . Sexual activity: Yes    Birth control/ protection: None   Other Topics Concern  . None   Social History Narrative  .  None   Additional Social History:                         Sleep: restless  Appetite:  Fair  Current Medications: Current Facility-Administered Medications  Medication Dose Route Frequency Provider Last Rate Last Dose  . acetaminophen (TYLENOL) tablet 650 mg  650 mg Oral Q6H PRN Nanci Pina, FNP      . alum & mag hydroxide-simeth (MAALOX/MYLANTA) 200-200-20 MG/5ML suspension 30 mL  30 mL Oral Q4H PRN Nanci Pina, FNP      . busPIRone (BUSPAR) tablet 7.5 mg  7.5 mg Oral BID Benjamine Mola, FNP   7.5 mg at 10/15/16 0804  . hydrocerin (EUCERIN) cream   Topical BID Kerrie Buffalo, NP      . hydrOXYzine (ATARAX/VISTARIL) tablet 25 mg  25 mg Oral TID PRN Nanci Pina, FNP   25 mg at 10/15/16 1324  . lidocaine (LIDODERM) 5 % 1 patch  1 patch Transdermal Q24H Nanci Pina, FNP   1 patch at 10/11/16 1723  . magnesium hydroxide (MILK OF MAGNESIA) suspension 30 mL  30 mL Oral Daily PRN Nanci Pina, FNP      . meloxicam (MOBIC) tablet 7.5 mg  7.5 mg Oral Daily Nanci Pina, FNP   7.5 mg at 10/15/16 0804  . methocarbamol (ROBAXIN) tablet 750  mg  750 mg Oral Q8H PRN Benjamine Mola, FNP      . OLANZapine zydis (ZYPREXA) disintegrating tablet 5 mg  5 mg Oral TID PRN Ursula Alert, MD   5 mg at 10/15/16 1324   Or  . OLANZapine (ZYPREXA) injection 5 mg  5 mg Intramuscular TID PRN Ursula Alert, MD      . QUEtiapine (SEROQUEL) tablet 50 mg  50 mg Oral QHS Anna Beaird, MD      . sertraline (ZOLOFT) tablet 50 mg  50 mg Oral Daily Ursula Alert, MD   50 mg at 10/15/16 0804    Lab Results:  Results for orders placed or performed during the hospital encounter of 10/11/16 (from the past 48 hour(s))  Comprehensive metabolic panel     Status: Abnormal   Collection Time: 10/15/16  7:56 AM  Result Value Ref Range   Sodium 137 135 - 145 mmol/L   Potassium 3.8 3.5 - 5.1 mmol/L   Chloride 105 101 - 111 mmol/L   CO2 22 22 - 32 mmol/L   Glucose, Bld 126 (H) 65 - 99 mg/dL   BUN  8 6 - 20 mg/dL   Creatinine, Ser 0.67 0.44 - 1.00 mg/dL   Calcium 9.3 8.9 - 10.3 mg/dL   Total Protein 8.2 (H) 6.5 - 8.1 g/dL   Albumin 4.2 3.5 - 5.0 g/dL   AST 25 15 - 41 U/L   ALT 33 14 - 54 U/L   Alkaline Phosphatase 72 38 - 126 U/L   Total Bilirubin 0.6 0.3 - 1.2 mg/dL   GFR calc non Af Amer >60 >60 mL/min   GFR calc Af Amer >60 >60 mL/min    Comment: (NOTE) The eGFR has been calculated using the CKD EPI equation. This calculation has not been validated in all clinical situations. eGFR's persistently <60 mL/min signify possible Chronic Kidney Disease.    Anion gap 10 5 - 15    Comment: Performed at [facility]    Blood Alcohol level:  Lab Results  Component Value Date   [facility] <5 56/38/7564    Metabolic Disorder Labs: Lab Results  Component Value Date   HGBA1C 5.1 10/12/2016   MPG 100 10/12/2016   Lab Results  Component Value Date   PROLACTIN 10.8 10/12/2016   Lab Results  Component Value Date   CHOL 154 10/12/2016   TRIG 99 10/12/2016   HDL 47 10/12/2016   CHOLHDL 3.3 10/12/2016   VLDL 20 10/12/2016   LDLCALC 87 10/12/2016    Physical Findings: AIMS: Facial and Oral Movements Muscles of Facial Expression: None, normal Lips and Perioral Area: None, normal Jaw: None, normal Tongue: None, normal,Extremity Movements Upper (arms, wrists, hands, fingers): None, normal Lower (legs, knees, ankles, toes): None, normal, Trunk Movements Neck, shoulders, hips: None, normal, Overall Severity Severity of abnormal movements (highest score from questions above): None, normal Incapacitation due to abnormal movements: None, normal Patient's awareness of abnormal movements (rate only patient's report): No Awareness, Dental Status Current problems with teeth and/or dentures?: No Does patient usually wear dentures?: No  CIWA:    COWS:     Musculoskeletal: Strength & Muscle Tone: within normal limits Gait & Station: normal Patient leans:  N/A  Psychiatric Specialty Exam: Physical Exam  Nursing note and vitals reviewed.   Review of Systems  Psychiatric/Behavioral: Positive for substance abuse. Negative for hallucinations. The patient is nervous/anxious.   All other systems reviewed and are negative.   Blood pressure (!) 128/92,  pulse (!) 121, temperature 99.1 F (37.3 C), resp. rate 20, height 5' 7"  (1.702 m), weight 69.4 kg (153 lb), last menstrual period 09/04/2016, SpO2 100 %.Body mass index is 23.96 kg/m.  General Appearance: Casual  Eye Contact:  Fair  Speech:  Clear and Coherent  Volume:  Decreased  Mood:  Anxious and Depressed  Affect:  Depressed  Thought Process:  Coherent, Goal Directed and Descriptions of Associations: Circumstantial  Orientation:  Full (Time, Place, and Person)  Thought Content:  Paranoid Ideation and Rumination  Suicidal Thoughts:  No  Homicidal Thoughts:  No  Memory:  Immediate;   Fair Recent;   Fair Remote;   Fair  Judgement:  Fair  Insight:  Present  Psychomotor Activity:  Normal  Concentration:  Concentration: Fair and Attention Span: Fair  Recall:  [employer] of Knowledge:  Fair  Language:  Good  Akathisia:  No  Handed:  Right  AIMS (if indicated):     Assets:  Communication Skills Desire for Improvement Social Support  ADL's:  Intact  Cognition:  WNL  Sleep:  Number of Hours: 7   MDD (major depressive disorder), recurrent, severe, with psychosis ([place]) improving  Will continue today 10/15/16  plan as below except where it is noted.   Treatment Plan Summary:Patient today seen as paranoid , anxious - will readjust medications. Daily contact with patient to assess and evaluate symptoms and progress in treatment and Medication management    Medications: -Increased Zoloft to 50 mg po daily. -Buspar 7.72m po bid for anxiety -Start Seroquel 50 mg po qhs for psychosis/insomnia. -Will discontinue trazodone , since seroquel will help with sleep. -Discontinue flexeril to  avoid cholinergic concerns -Replace it with Robaxin 7550mpo q8h prn back pain/spasm -Continue Lidoderm 5% patch for back -Continue vistaril 2550mo q6h anxiety - Labs - repeat CMP since K+ was low- CMP - WNL. Will continue to monitor vitals ,medication compliance and treatment side effects while patient is here.  CSW will continue  working on disposition.  Patient to participate in therapeutic milieu  Odelle Kosier, MD 10/15/2016, 2:00 PM

## 2016-10-15 NOTE — Progress Notes (Signed)
Jacqueline Horn had been in room and in bed for much of the evening, minimal interaction in milieu. Jacqueline Horn has appeared flat and withdrawn with very minimal eye contact or engagement with staff or peers. Jacqueline Horn did not receive and bedtime medications as none were scheduled and did not request any medications. A. Support and encouragement provided. R. Safety maintained, will continue to monitor.

## 2016-10-15 NOTE — [facility] Group Notes (Signed)
[facility] LCSW Group Therapy  10/15/2016 1:15 pm  Type of Therapy: Process Group Therapy  Participation Level:  Active  Participation Quality:  Appropriate  Affect:  Flat  Cognitive:  Oriented  Insight:  Improving  Engagement in Group:  Limited  Engagement in Therapy:  Limited  Modes of Intervention:  Activity, Clarification, Education, Problem-solving and Support  Summary of Progress/Problems: Today's group addressed the issue of overcoming obstacles.  Patients were asked to identify their biggest obstacle post d/c that stands in the way of their on-going success, and then problem solve as to how to manage this.  Stayed the entire time, engaged throughout.  "I'm still trying to figure out what happened before I came in here; I need to know the truth."  Later, revealed it was more about figuring out who to trust for support.  "I have never let anyone in, and I don't mind being alone, but it means I don't have anyone to turn to.  I've been isolated for about 10 years now." Agreed that it would take practice to learn to be around others, and received feedback from others that she is doing a nice job practicing now.   Daryel Geraldorth, Charon Smedberg B 10/15/2016   3:01 PM

## 2016-10-16 MED ORDER — QUETIAPINE FUMARATE 100 MG PO TABS
100.0000 mg | ORAL_TABLET | Freq: Every day | ORAL | Status: DC
  Administered 2016-10-16: 100 mg via ORAL
  Filled 2016-10-16 (×2): qty 1

## 2016-10-16 NOTE — Progress Notes (Signed)
D: Pt is flat isolative and withdrawn to room; was in bed all evening. Pt at the time of assessment endorses moderate anxiety and depression. Pt also complained of auditory hallucination; states "whenever I walk down that hall I hear the other patients talking about me" Pt however denied SI, HI or pain. A: Medications offered as prescribed.  Support, encouragement, and safe environment provided.  15-minute safety checks continue. R: Pt was med compliant.  Pt did not attend group. Safety checks continue

## 2016-10-16 NOTE — [facility] Group Notes (Signed)
[facility] LCSW Group Therapy  10/16/2016 2:44 PM   Type of Therapy:  Group Therapy   Participation Level:  Engaged  Participation Quality:  Attentive  Affect:  Appropriate   Cognitive:  Alert   Insight:  Engaged  Engagement in Therapy:  Improving   Modes of Intervention:  Education, Exploration, Socialization   Summary of Progress/Problems: Engaged throughout, stayed entire time.   Onalee HuaDavid from [employer] was here to tell his story of recovery, inform patients about [employer] and play his guitar.   Baldo DaubJolan Sher Hellinger 10/16/2016 2:44 PM

## 2016-10-16 NOTE — Progress Notes (Signed)
Adult Psychoeducational Group Note  Date:  10/16/2016 Time:  9:59 PM  Group Topic/Focus:  Wrap-Up Group:   The focus of this group is to help patients review their daily goal of treatment and discuss progress on daily workbooks.   Participation Level:  Active  Participation Quality:  Appropriate  Affect:  Appropriate  Cognitive:  Appropriate  Insight: Appropriate  Engagement in Group:  Engaged  Modes of Intervention:  Socialization and Support  Additional Comments:  Patient attended and participated in group tonight. She reports that she attended her meals and group. She spoke with the doctor and some of her medication was changed. She is feeling better today than before.  Lita MainsFrancis, [facility] [place] 10/16/2016, 9:59 PM

## 2016-10-16 NOTE — Progress Notes (Signed)
Recreation Therapy Notes  Date: 10/16/16 Time: 1000 Location: 500 Hall Dayroom  Group Topic: Communication, Team Building, Problem Solving  Goal Area(s) Addresses:  Patient will effectively work with peer towards shared goal.  Patient will identify skills used to make activity successful.  Patient will identify how skills used during activity can be used to reach post d/c goals.   Behavioral Response: Engaged  Intervention: STEM Activity  Activity: Stage managerLanding Pad. In teams patients were given 12 plastic drinking straws and a length of masking tape. Using the materials provided patients were asked to build a landing pad to catch a golf ball dropped from approximately 6 feet in the air.   Education: Pharmacist, communityocial Skills, Discharge Planning   Education Outcome: Acknowledges education/In group clarification offered/Needs additional education.   Clinical Observations/Feedback:  Pt worked well with group.  Pt stood out as the leader in her group.  Pt expressed the skills from the group would help "give you other people's perspective".  Pt also stated "you don't have to make decisions on you own".   Caroll RancherMarjette Treavor Blomquist, LRT/CTRS     Caroll RancherLindsay, Shaelyn Decarli A 10/16/2016 12:15 PM

## 2016-10-16 NOTE — Progress Notes (Signed)
[facility] MD Progress Note  10/16/2016 3:32 PM Jacqueline Horn  MRN:  [id number] Subjective: Patient states " I feel like every one is talking about me. I want my health information to be protected. I went out to cafeteria that day after you came and told me you are going to discharge me at 1 'o ' clock and I heard every one mumbling as I walked in the hallway " 1 'o' clock repeatedly. I feel every one is talking about me."        Objective: Jacqueline Horn seen as anxious this AM , worried about how she went through this episode. Pt today seen as anxious and paranoid - hears AH of peers talking about her , making comments at her. Pt continues to be delusional and anxious. Discussed her medications and her sx , provided education. Pt agrees to take her medications and ask for help if needed. Per RN - pt often observed as depressed , isolative - continues to need a lot of support.     Principal Problem: MDD (major depressive disorder), recurrent, severe, with psychosis ([place]) Diagnosis:   Patient Active Problem List   Diagnosis Date Noted  . MDD (major depressive disorder), recurrent, severe, with psychosis ([place]) [F33.3] 10/13/2016  . GAD (generalized anxiety disorder) [F41.1] 10/13/2016  . Cannabis abuse [F12.10] 10/11/2016   Total Time spent with patient: 25 minutes  Past Psychiatric History:Please see H&P.   Past Medical History:  Past Surgical History:  Procedure Laterality Date  . BREAST ENHANCEMENT SURGERY Bilateral   . CESAREAN SECTION     Family History: Please see H&P.  Family Psychiatric  History: Please see H&P.  Social History:  History  Alcohol Use No     History  Drug Use  . Types: Marijuana    Social History   Social History  . Marital status: Single    Spouse name: N/A  . Number of children: N/A  . Years of education: N/A   Social History Main Topics  . Smoking status: Former Smoker    Types: Cigarettes  . Smokeless tobacco: Never Used  .  Alcohol use No  . Drug use:     Types: Marijuana  . Sexual activity: Yes    Birth control/ protection: None   Other Topics Concern  . None   Social History Narrative  . None   Additional Social History:                         Sleep: restless  Appetite:  Fair  Current Medications: Current Facility-Administered Medications  Medication Dose Route Frequency Provider Last Rate Last Dose  . acetaminophen (TYLENOL) tablet 650 mg  650 mg Oral Q6H PRN Nanci Pina, FNP      . alum & mag hydroxide-simeth (MAALOX/MYLANTA) 200-200-20 MG/5ML suspension 30 mL  30 mL Oral Q4H PRN Nanci Pina, FNP      . busPIRone (BUSPAR) tablet 7.5 mg  7.5 mg Oral BID Benjamine Mola, FNP   7.5 mg at 10/16/16 4008  . hydrocerin (EUCERIN) cream   Topical BID Kerrie Buffalo, NP      . hydrOXYzine (ATARAX/VISTARIL) tablet 25 mg  25 mg Oral TID PRN Nanci Pina, FNP   25 mg at 10/15/16 1324  . lidocaine (LIDODERM) 5 % 1 patch  1 patch Transdermal Q24H Nanci Pina, FNP   1 patch at 10/11/16 1723  . magnesium hydroxide (MILK OF MAGNESIA) suspension 30  mL  30 mL Oral Daily PRN Nanci Pina, FNP      . meloxicam [facility]) tablet 7.5 mg  7.5 mg Oral Daily Nanci Pina, FNP   7.5 mg at 10/16/16 6295  . methocarbamol (ROBAXIN) tablet 750 mg  750 mg Oral Q8H PRN Benjamine Mola, FNP      . OLANZapine zydis (ZYPREXA) disintegrating tablet 5 mg  5 mg Oral TID PRN Ursula Alert, MD   5 mg at 10/15/16 1324   Or  . OLANZapine (ZYPREXA) injection 5 mg  5 mg Intramuscular TID PRN Ursula Alert, MD      . QUEtiapine (SEROQUEL) tablet 100 mg  100 mg Oral QHS Lulla Linville, MD      . sertraline (ZOLOFT) tablet 50 mg  50 mg Oral Daily Ursula Alert, MD   50 mg at 10/16/16 2841    Lab Results:  Results for orders placed or performed during the hospital encounter of 10/11/16 (from the past 48 hour(s))  Comprehensive metabolic panel     Status: Abnormal   Collection Time: 10/15/16  7:56 AM  Result  Value Ref Range   Sodium 137 135 - 145 mmol/L   Potassium 3.8 3.5 - 5.1 mmol/L   Chloride 105 101 - 111 mmol/L   CO2 22 22 - 32 mmol/L   Glucose, Bld 126 (H) 65 - 99 mg/dL   BUN 8 6 - 20 mg/dL   Creatinine, Ser 0.67 0.44 - 1.00 mg/dL   Calcium 9.3 8.9 - 10.3 mg/dL   Total Protein 8.2 (H) 6.5 - 8.1 g/dL   Albumin 4.2 3.5 - 5.0 g/dL   AST 25 15 - 41 U/L   ALT 33 14 - 54 U/L   Alkaline Phosphatase 72 38 - 126 U/L   Total Bilirubin 0.6 0.3 - 1.2 mg/dL   GFR calc non Af Amer >60 >60 mL/min   GFR calc Af Amer >60 >60 mL/min    Comment: (NOTE) The eGFR has been calculated using the CKD EPI equation. This calculation has not been validated in all clinical situations. eGFR's persistently <60 mL/min signify possible Chronic Kidney Disease.    Anion gap 10 5 - 15    Comment: Performed at [facility]    Blood Alcohol level:  Lab Results  Component Value Date   [facility] <5 32/44/0102    Metabolic Disorder Labs: Lab Results  Component Value Date   HGBA1C 5.1 10/12/2016   MPG 100 10/12/2016   Lab Results  Component Value Date   PROLACTIN 10.8 10/12/2016   Lab Results  Component Value Date   CHOL 154 10/12/2016   TRIG 99 10/12/2016   HDL 47 10/12/2016   CHOLHDL 3.3 10/12/2016   VLDL 20 10/12/2016   LDLCALC 87 10/12/2016    Physical Findings: AIMS: Facial and Oral Movements Muscles of Facial Expression: None, normal Lips and Perioral Area: None, normal Jaw: None, normal Tongue: None, normal,Extremity Movements Upper (arms, wrists, hands, fingers): None, normal Lower (legs, knees, ankles, toes): None, normal, Trunk Movements Neck, shoulders, hips: None, normal, Overall Severity Severity of abnormal movements (highest score from questions above): None, normal Incapacitation due to abnormal movements: None, normal Patient's awareness of abnormal movements (rate only patient's report): No Awareness, Dental Status Current problems with teeth and/or dentures?:  No Does patient usually wear dentures?: No  CIWA:  CIWA-Ar Total: 1 COWS:  COWS Total Score: 3  Musculoskeletal: Strength & Muscle Tone: within normal limits Gait & Station: normal Patient  leans: N/A  Psychiatric Specialty Exam: Physical Exam  Nursing note and vitals reviewed.   Review of Systems  Psychiatric/Behavioral: Positive for hallucinations and substance abuse. The patient is nervous/anxious.   All other systems reviewed and are negative.   Blood pressure 124/86, pulse (!) 104, temperature 98.9 F (37.2 C), resp. rate 16, height 5' 7"  (1.702 m), weight 69.4 kg (153 lb), last menstrual period 09/04/2016, SpO2 100 %.Body mass index is 23.96 kg/m.  General Appearance: Casual  Eye Contact:  Fair  Speech:  Clear and Coherent  Volume:  Decreased  Mood:  Anxious and Depressed  Affect:  Depressed  Thought Process:  Coherent, Goal Directed and Descriptions of Associations: Circumstantial  Orientation:  Full (Time, Place, and Person)  Thought Content:  Delusions, Hallucinations: Auditory, Paranoid Ideation and Rumination  Suicidal Thoughts:  No  Homicidal Thoughts:  No  Memory:  Immediate;   Fair Recent;   Fair Remote;   Fair  Judgement:  Fair  Insight:  Present  Psychomotor Activity:  Normal  Concentration:  Concentration: Fair and Attention Span: Fair  Recall:  [employer] of Knowledge:  Fair  Language:  Good  Akathisia:  No  Handed:  Right  AIMS (if indicated):     Assets:  Communication Skills Desire for Improvement Social Support  ADL's:  Intact  Cognition:  WNL  Sleep:  Number of Hours: 6.75   MDD (major depressive disorder), recurrent, severe, with psychosis ([place]) improving  Will continue today 10/16/16  plan as below except where it is noted.   Treatment Plan Summary:Patient today seen as delusional , has AH - paranoid - continue to increase her seroquel.   Daily contact with patient to assess and evaluate symptoms and progress in treatment and  Medication management    Medications: -Increased Zoloft to 50 mg po daily. -Buspar 7.11m po bid for anxiety -Will increase Seroquel to 100 mg po qhs for psychosis/insomnia. -Will discontinue trazodone , since seroquel will help with sleep. -Discontinue flexeril to avoid cholinergic concerns -Replace it with Robaxin 7584mpo q8h prn back pain/spasm -Continue Lidoderm 5% patch for back -Continue vistaril 259mo q6h anxiety - Labs - repeat CMP since K+ was low- CMP - WNL. Will continue to monitor vitals ,medication compliance and treatment side effects while patient is here.  CSW will continue  working on disposition.  Patient to participate in therapeutic milieu  Asmi Fugere, MD 10/16/2016, 3:32 PM

## 2016-10-16 NOTE — Plan of Care (Signed)
Problem: Education: Goal: Knowledge of [facility] Education information/materials will improve Outcome: Progressing Nurse discussed depression/anxiety/coping skills with patient.    

## 2016-10-16 NOTE — Progress Notes (Addendum)
D:  Patient's self inventory sheet, patient has fair sleep, no sleep medication given.  Poor appetite, low energy level, good concentration.  Rated depression and hopeless 2, anxiety 5.  Denied withdrawals.  Denied SI.  Denied physical problems.  Denied pain.  Goal is going home.  Plans to attend group, take meds.  Does have discharge plans. A:  Medications administered per MD orders.  Emotional support and encouragement given patient. R:  Denied SI and HI, contracts for safety.  Denied A/V hallucinations.  Safety maintained with 15 minute checks.  Patient stated this morning that people were talking about her.  "I'm not crazy.  I don't take pills.  All I have left is my bible and God to the end.  I have nothing left , lost everything."

## 2016-10-17 MED ORDER — QUETIAPINE FUMARATE 25 MG PO TABS
125.0000 mg | ORAL_TABLET | Freq: Every day | ORAL | Status: DC
  Filled 2016-10-17 (×2): qty 5
  Filled 2016-10-17: qty 18
  Filled 2016-10-17: qty 5

## 2016-10-17 NOTE — Tx Team (Signed)
Interdisciplinary Treatment and Diagnostic Plan Update  10/17/2016 Time of Session: 8:15 AM  Jacqueline Horn MRN: [id number]  Principal Diagnosis: MDD (major depressive disorder), recurrent, severe, with psychosis ([place])  Secondary Diagnoses: Principal Problem:   MDD (major depressive disorder), recurrent, severe, with psychosis ([place]) Active Problems:   Cannabis abuse   GAD (generalized anxiety disorder)   Current Medications:  Current Facility-Administered Medications  Medication Dose Route Frequency Provider Last Rate Last Dose  . acetaminophen (TYLENOL) tablet 650 mg  650 mg Oral Q6H PRN Nanci Pina, FNP      . alum & mag hydroxide-simeth (MAALOX/MYLANTA) 200-200-20 MG/5ML suspension 30 mL  30 mL Oral Q4H PRN Nanci Pina, FNP      . busPIRone (BUSPAR) tablet 7.5 mg  7.5 mg Oral BID Benjamine Mola, FNP   7.5 mg at 10/16/16 1715  . hydrocerin (EUCERIN) cream   Topical BID Kerrie Buffalo, NP      . hydrOXYzine (ATARAX/VISTARIL) tablet 25 mg  25 mg Oral TID PRN Nanci Pina, FNP   25 mg at 10/16/16 1717  . lidocaine (LIDODERM) 5 % 1 patch  1 patch Transdermal Q24H Nanci Pina, FNP   1 patch at 10/11/16 1723  . magnesium hydroxide (MILK OF MAGNESIA) suspension 30 mL  30 mL Oral Daily PRN Nanci Pina, FNP      . meloxicam (MOBIC) tablet 7.5 mg  7.5 mg Oral Daily Nanci Pina, FNP   7.5 mg at 10/16/16 2637  . methocarbamol (ROBAXIN) tablet 750 mg  750 mg Oral Q8H PRN Benjamine Mola, FNP      . OLANZapine zydis (ZYPREXA) disintegrating tablet 5 mg  5 mg Oral TID PRN Ursula Alert, MD   5 mg at 10/15/16 1324   Or  . OLANZapine (ZYPREXA) injection 5 mg  5 mg Intramuscular TID PRN Ursula Alert, MD      . QUEtiapine (SEROQUEL) tablet 100 mg  100 mg Oral QHS Ursula Alert, MD   100 mg at 10/16/16 2127  . sertraline (ZOLOFT) tablet 50 mg  50 mg Oral Daily Ursula Alert, MD   50 mg at 10/16/16 8588    PTA Medications: Prescriptions Prior to Admission  Medication  Sig Dispense Refill Last Dose  . cyclobenzaprine (FLEXERIL) 10 MG tablet Take 1 tablet (10 mg total) by mouth 3 (three) times daily as needed for muscle spasms (and pain). (Patient not taking: Reported on 10/10/2016) 20 tablet 0 Not Taking at Unknown time  . lidocaine (LIDODERM) 5 % Place 1 patch onto the skin daily. Remove & Discard patch within 12 hours or as directed by MD (Patient not taking: Reported on 10/10/2016) 30 patch 0 Not Taking at Unknown time  . meloxicam (MOBIC) 7.5 MG tablet Take 1 tablet (7.5 mg total) by mouth daily. (Patient not taking: Reported on 10/10/2016) 14 tablet 0 Not Taking at Unknown time  . methocarbamol (ROBAXIN) 500 MG tablet Take 1 tablet (500 mg total) by mouth 2 (two) times daily. (Patient not taking: Reported on 10/10/2016) 20 tablet 0 Not Taking at Unknown time  . naproxen (NAPROSYN) 500 MG tablet Take 1 tablet (500 mg total) by mouth 2 (two) times daily. (Patient not taking: Reported on 10/10/2016) 30 tablet 0 Not Taking at Unknown time  . traMADol (ULTRAM) 50 MG tablet Take 1 tablet (50 mg total) by mouth every 6 (six) hours as needed. (Patient not taking: Reported on 10/10/2016) 15 tablet 0 Not Taking at Unknown time  Treatment Modalities: Medication Management, Group therapy, Case management,  1 to 1 session with clinician, Psychoeducation, Recreational therapy.   Physician Treatment Plan for Primary Diagnosis: MDD (major depressive disorder), recurrent, severe, with psychosis ([place]) Long Term Goal(s): Improvement in symptoms so as ready for discharge  Short Term Goals: Ability to identify changes in lifestyle to reduce recurrence of condition will improve Ability to verbalize feelings will improve Ability to disclose and discuss suicidal ideas Ability to demonstrate self-control will improve Ability to identify and develop effective coping behaviors will improve Ability to maintain clinical measurements within normal limits will improve Compliance with prescribed  medications will improve Ability to identify triggers associated with substance abuse/mental health issues will improve   Medication Management: Evaluate patient's response, side effects, and tolerance of medication regimen.  Therapeutic Interventions: 1 to 1 sessions, Unit Group sessions and Medication administration.  Evaluation of Outcomes: Adequate for Discharge  Physician Treatment Plan for Secondary Diagnosis: Principal Problem:   MDD (major depressive disorder), recurrent, severe, with psychosis ([place]) Active Problems:   Cannabis abuse   GAD (generalized anxiety disorder)   Long Term Goal(s): Improvement in symptoms so as ready for discharge  Short Term Goals: Ability to identify changes in lifestyle to reduce recurrence of condition will improve Ability to verbalize feelings will improve Ability to disclose and discuss suicidal ideas Ability to demonstrate self-control will improve Ability to identify and develop effective coping behaviors will improve Ability to maintain clinical measurements within normal limits will improve Compliance with prescribed medications will improve Ability to identify triggers associated with substance abuse/mental health issues will improve   Medication Management: Evaluate patient's response, side effects, and tolerance of medication regimen.  Therapeutic Interventions: 1 to 1 sessions, Unit Group sessions and Medication administration.  Evaluation of Outcomes: Adequate for Discharge   RN Treatment Plan for Primary Diagnosis: MDD (major depressive disorder), recurrent, severe, with psychosis ([place]) Long Term Goal(s): Knowledge of disease and therapeutic regimen to maintain health will improve  Short Term Goals: Ability to identify and develop effective coping behaviors will improve and Compliance with prescribed medications will improve  Medication Management: RN will administer medications as ordered by provider, will assess and evaluate  patient's response and provide education to patient for prescribed medication. RN will report any adverse and/or side effects to prescribing provider.  Therapeutic Interventions: 1 on 1 counseling sessions, Psychoeducation, Medication administration, Evaluate responses to treatment, Monitor vital signs and CBGs as ordered, Perform/monitor CIWA, COWS, AIMS and Fall Risk screenings as ordered, Perform wound care treatments as ordered.  Evaluation of Outcomes: Adequate for Discharge   LCSW Treatment Plan for Primary Diagnosis: MDD (major depressive disorder), recurrent, severe, with psychosis ([place]) Long Term Goal(s): Safe transition to appropriate next level of care at discharge, Engage patient in therapeutic group addressing interpersonal concerns.  Short Term Goals: Engage patient in aftercare planning with referrals and resources  Therapeutic Interventions: Assess for all discharge needs, 1 to 1 time with Social worker, Explore available resources and support systems, Assess for adequacy in community support network, Educate family and significant other(s) on suicide prevention, Complete Psychosocial Assessment, Interpersonal group therapy.  Evaluation of Outcomes: Met   Progress in Treatment: Attending groups: Yes Participating in groups: Yes Taking medication as prescribed: Yes Toleration medication: Yes, no side effects reported at this time Family/Significant other contact made: No Patient understands diagnosis: Yes AEB asking for help with confusion Discussing patient identified problems/goals with staff: Yes Medical problems stabilized or resolved: Yes Denies suicidal/homicidal ideation: Yes Issues/concerns  per patient self-inventory: None Other: N/A  New problem(s) identified: None identified at this time.   New Short Term/Long Term Goal(s): None identified at this time.   Discharge Plan or Barriers: return home, follow up outpt  Reason for Continuation of  Hospitalization:     Estimated Length of Stay: D/C tomorrow  Attendees: Patient: 10/17/2016  8:15 AM  Physician: Ursula Alert, MD 10/17/2016  8:15 AM  Nursing: Hoy Register, RN 10/17/2016  8:15 AM  RN Care Manager: Lars Pinks, RN 10/17/2016  8:15 AM  Social Worker: Ripley Fraise 10/17/2016  8:15 AM  Recreational Therapist: Laretta Bolster  10/17/2016  8:15 AM  Other: Norberto Sorenson 10/17/2016  8:15 AM  Other:  10/17/2016  8:15 AM    Scribe for Treatment Team:  Roque Lias LCSW 10/17/2016 8:15 AM

## 2016-10-17 NOTE — [facility] Group Notes (Signed)
[facility] Group Notes:  (Counselor/Nursing/MHT/Case Management/Adjunct)  10/17/2016 1:15PM  Type of Therapy:  Group Therapy  Participation Level:  Active  Participation Quality:  Appropriate  Affect:  Flat  Cognitive:  Oriented  Insight:  Improving  Engagement in Group:  Limited  Engagement in Therapy:  Limited  Modes of Intervention:  Discussion, Exploration and Socialization  Summary of Progress/Problems: The topic for group was balance in life.  Pt participated in the discussion about when their life was in balance and out of balance and how this feels.  Pt discussed ways to get back in balance and short term goals they can work on to get where they want to be. Stayed the entire time, engaged throughout.  Did a nice job of ignoring another patient who was deliberately trying to provoke her.  Stated she is balanced today, and knows this because she is no longer confused, so no longer as anxious as she was previously.  Also talked about avoiding certain situations when she leaves, but did not go into detail.  Cited her faith as something that helps her a lot, and also how there is no substitute for practice to get better at something.  Shared her story of learning how not to stutter.   Jacqueline Horn, Eliasar Hlavaty B 10/17/2016 3:44 PM

## 2016-10-17 NOTE — Progress Notes (Signed)
Patient has been isolative to self this shift. Patient was noted to be tearful but unable to verbalize what was bothering her.  Patient denies SI, HI, and AVH.   Assess patient for safety, offer medications as prescribed, engage patient in 1:1 staff talks,   Continue to monitor patient as planned.   Patient able to contract for safety.  

## 2016-10-17 NOTE — Progress Notes (Signed)
Adult Psychoeducational Group Note  Date:  10/17/2016 Time:  8:43 PM  Group Topic/Focus:  Wrap-Up Group:   The focus of this group is to help patients review their daily goal of treatment and discuss progress on daily workbooks.   Participation Level:  Active  Participation Quality:  Appropriate  Affect:  Appropriate  Cognitive:  Appropriate  Insight: Appropriate  Engagement in Group:  Engaged  Modes of Intervention:  Discussion  Additional Comments:  The patient expressed that she rates today a 9.The patient also said that she was able to attend groups and each meal today which is good. Octavio Mannshigpen, Emsley Custer Lee 10/17/2016, 8:43 PM

## 2016-10-17 NOTE — Progress Notes (Signed)
Recreation Therapy Notes  Date: 10/17/16 Time: 1000 Location: 500 Hall Dayroom  Group Topic: Stress Management  Goal Area(s) Addresses:  Patient will verbalize importance of using healthy stress management.  Patient will identify positive emotions associated with healthy stress management.   Behavioral Response:  Minimal  Intervention: Stress Management  Activity :  Deep Breathing, Meditation.  LRT introduced the stress management techniques of deep breathing and meditation to patients.  LRT read a script to guide the patients through the deep breathing exercise and played a meditation that allowed patients to take inventory of the way they were feeling.  Patients were to follow along with the script and meditation to engage in the activity.  Education:  Stress Management, Discharge Planning.   Education Outcome: Acknowledges edcuation/In group clarification offered/Needs additional education  Clinical Observations/Feedback: Pt arrived the last 10 minutes of group.  Pt joined right into the activity.  Pt seemed flat but pleasant.  Pt expressed when she is stressed "my hand shakes, it's hard to breath and I need to go outside to get some air".   Caroll RancherMarjette Netha Horn, LRT/CTRS     Lillia AbedLindsay, Cheyenne Bordeaux A 10/17/2016 11:40 AM

## 2016-10-17 NOTE — Progress Notes (Signed)
[facility] MD Progress Note  10/17/2016 1:16 PM Jacqueline Horn  MRN:  [id number] Subjective: Patient states " I feel better. I still have these thoughts that people are gossiping about me and talking about me. The thoughts that TV is talking to me is gone.'      Objective: Jacqueline Horn seen as anxious , reports vistaril has been helpful. Pt does not think Buspar is actually effective and is OK with discontinuing it. Pt reports continued paranoia, ideas of reference. Per staff - no disruptive issues noted, tolerating medications well. Continue readjust medications.       Principal Problem: MDD (major depressive disorder), recurrent, severe, with psychosis (HCC) Diagnosis:   Patient Active Problem List   Diagnosis Date Noted  . MDD (major depressive disorder), recurrent, severe, with psychosis (HCC) [F33.3] 10/13/2016  . GAD (generalized anxiety disorder) [F41.1] 10/13/2016  . Cannabis abuse [F12.10] 10/11/2016   Total Time spent with patient: 25 minutes  Past Psychiatric History:Please see H&P.   Past Medical History:  Past Surgical History:  Procedure Laterality Date  . BREAST ENHANCEMENT SURGERY Bilateral   . CESAREAN SECTION     Family History: Please see H&P.  Family Psychiatric  History: Please see H&P.  Social History:  History  Alcohol Use No     History  Drug Use  . Types: Marijuana    Social History   Social History  . Marital status: Single    Spouse name: N/A  . Number of children: N/A  . Years of education: N/A   Social History Main Topics  . Smoking status: Former Smoker    Types: Cigarettes  . Smokeless tobacco: Never Used  . Alcohol use No  . Drug use:     Types: Marijuana  . Sexual activity: Yes    Birth control/ protection: None   Other Topics Concern  . None   Social History Narrative  . None   Additional Social History:                         Sleep: Fair  Appetite:  Fair  Current Medications: Current  Facility-Administered Medications  Medication Dose Route Frequency Provider Last Rate Last Dose  . acetaminophen (TYLENOL) tablet 650 mg  650 mg Oral Q6H PRN Truman Hayward, FNP      . alum & mag hydroxide-simeth (MAALOX/MYLANTA) 200-200-20 MG/5ML suspension 30 mL  30 mL Oral Q4H PRN Truman Hayward, FNP      . hydrocerin (EUCERIN) cream   Topical BID Adonis Brook, NP      . hydrOXYzine (ATARAX/VISTARIL) tablet 25 mg  25 mg Oral TID PRN Truman Hayward, FNP   25 mg at 10/16/16 1717  . lidocaine (LIDODERM) 5 % 1 patch  1 patch Transdermal Q24H Truman Hayward, FNP   1 patch at 10/11/16 1723  . magnesium hydroxide (MILK OF MAGNESIA) suspension 30 mL  30 mL Oral Daily PRN Truman Hayward, FNP      . meloxicam (MOBIC) tablet 7.5 mg  7.5 mg Oral Daily Truman Hayward, FNP   7.5 mg at 10/17/16 0824  . methocarbamol (ROBAXIN) tablet 750 mg  750 mg Oral Q8H PRN Beau Fanny, FNP      . OLANZapine zydis (ZYPREXA) disintegrating tablet 5 mg  5 mg Oral TID PRN Jomarie Longs, MD   5 mg at 10/15/16 1324   Or  . OLANZapine (ZYPREXA) injection 5 mg  5 mg Intramuscular  TID PRN Jomarie LongsSaramma Lafern Brinkley, MD      . QUEtiapine (SEROQUEL) tablet 125 mg  125 mg Oral QHS Myosha Cuadras, MD      . sertraline (ZOLOFT) tablet 50 mg  50 mg Oral Daily Jomarie LongsSaramma Joven Mom, MD   50 mg at 10/17/16 [id number]    Lab Results:  No results found for this or any previous visit (from the past 48 hour(s)).  Blood Alcohol level:  Lab Results  Component Value Date   ETH <5 10/10/2016    Metabolic Disorder Labs: Lab Results  Component Value Date   HGBA1C 5.1 10/12/2016   MPG 100 10/12/2016   Lab Results  Component Value Date   PROLACTIN 10.8 10/12/2016   Lab Results  Component Value Date   CHOL 154 10/12/2016   TRIG 99 10/12/2016   HDL 47 10/12/2016   CHOLHDL 3.3 10/12/2016   VLDL 20 10/12/2016   LDLCALC 87 10/12/2016    Physical Findings: AIMS: Facial and Oral Movements Muscles of Facial Expression: None, normal Lips and  Perioral Area: None, normal Jaw: None, normal Tongue: None, normal,Extremity Movements Upper (arms, wrists, hands, fingers): None, normal Lower (legs, knees, ankles, toes): None, normal, Trunk Movements Neck, shoulders, hips: None, normal, Overall Severity Severity of abnormal movements (highest score from questions above): None, normal Incapacitation due to abnormal movements: None, normal Patient's awareness of abnormal movements (rate only patient's report): No Awareness, Dental Status Current problems with teeth and/or dentures?: No Does patient usually wear dentures?: No  CIWA:  CIWA-Ar Total: 1 COWS:  COWS Total Score: 3  Musculoskeletal: Strength & Muscle Tone: within normal limits Gait & Station: normal Patient leans: N/A  Psychiatric Specialty Exam: Physical Exam  Nursing note and vitals reviewed.   Review of Systems  Psychiatric/Behavioral: Positive for hallucinations and substance abuse. The patient is nervous/anxious.   All other systems reviewed and are negative.   Blood pressure (!) 128/93, pulse (!) 105, temperature 99.1 F (37.3 C), temperature source Oral, resp. rate 16, height 5\' 7"  (1.702 m), weight 69.4 kg (153 lb), last menstrual period 09/04/2016, SpO2 100 %.Body mass index is 23.96 kg/m.  General Appearance: Casual  Eye Contact:  Fair  Speech:  Clear and Coherent  Volume:  Decreased  Mood:  Anxious and Depressed  Affect:  Depressed  Thought Process:  Coherent, Goal Directed and Descriptions of Associations: Circumstantial  Orientation:  Full (Time, Place, and Person)  Thought Content:  Delusions, Paranoid Ideation and Rumination  Suicidal Thoughts:  No  Homicidal Thoughts:  No  Memory:  Immediate;   Fair Recent;   Fair Remote;   Fair  Judgement:  Fair  Insight:  Present  Psychomotor Activity:  Normal  Concentration:  Concentration: Fair and Attention Span: Fair  Recall:  [employer]Fair  Fund of Knowledge:  Fair  Language:  Good  Akathisia:  No  Handed:   Right  AIMS (if indicated):     Assets:  Communication Skills Desire for Improvement Social Support  ADL's:  Intact  Cognition:  WNL  Sleep:  Number of Hours: 6.75   MDD (major depressive disorder), recurrent, severe, with psychosis (HCC) improving  Will continue today 10/17/16  plan as below except where it is noted.   Treatment Plan Summary:Patient today seen as delusional, anxious - although progressing. Continue treatment.     Daily contact with patient to assess and evaluate symptoms and progress in treatment and Medication management    Medications: -Increased Zoloft to 50 mg po daily. -Discontinue Buspar due to  lack of efficacy. -Will increase Seroquel to 125 mg po qhs for psychosis/insomnia. -Will discontinue trazodone , since seroquel will help with sleep. -Discontinue flexeril to avoid cholinergic concerns -Replace it with Robaxin 750mg  po q8h prn back pain/spasm -Continue Lidoderm 5% patch for back -Continue vistaril 25mg  po q6h anxiety - Labs - repeat CMP since K+ was low- CMP - WNL. Will continue to monitor vitals ,medication compliance and treatment side effects while patient is here.  CSW will continue  working on disposition.  Patient to participate in therapeutic milieu  Corean Yoshimura, MD 10/17/2016, 1:16 PM

## 2016-10-17 NOTE — Progress Notes (Signed)
Patient ID: Jacqueline Horn, female   DOB: June 23, 1976, 41 y.o.   MRN: [id number] D: Client visible on the unit, seen in dayroom watching TV, also has a visitor tonight. Client reports of her day "it was fine" minimal interaction, but is pleasant. A: Writer encouraged client to report any concerns. Medications reviewed, client refused Seroquel "I already slept good today" denies AVH. "just give me the Vistaril" Staff will monitor q6515min for safety. R: Client is safe on the unit, attended group.

## 2016-10-18 ENCOUNTER — Encounter (HOSPITAL_COMMUNITY): Payer: Self-pay | Admitting: Psychiatry

## 2016-10-18 MED ORDER — HYDROCERIN EX CREA: 1.0000 "application " | TOPICAL_CREAM | Freq: Two times a day (BID) | CUTANEOUS | 0 refills | Status: DC

## 2016-10-18 MED ORDER — HYDROXYZINE HCL 25 MG PO TABS: 25.0000 mg | ORAL_TABLET | Freq: Three times a day (TID) | ORAL | 0 refills | Status: DC | PRN

## 2016-10-18 MED ORDER — SERTRALINE HCL 50 MG PO TABS: 50.0000 mg | ORAL_TABLET | Freq: Every day | ORAL | 0 refills | Status: DC

## 2016-10-18 MED ORDER — QUETIAPINE FUMARATE 50 MG PO TABS: 125.0000 mg | ORAL_TABLET | Freq: Every day | ORAL | 0 refills | Status: AC

## 2016-10-18 NOTE — Plan of Care (Signed)
Problem: [facility] Participation in Recreation Therapeutic Interventions Goal: STG-Patient will identify at least five coping skills for ** STG: Coping Skills - Patient will be able to identify at least 5 coping skills for anger by conclusion of recreation therapy tx  Outcome: Completed/Met Date Met: 10/18/16 Pt was able to identify coping skills at completion of coping skills recreation therapy session.  Victorino Sparrow, LRT/CTRS

## 2016-10-18 NOTE — Progress Notes (Signed)
Pt discharged to lobby. Pt was stable and appreciative at that time. All papers, samples and prescriptions were given and valuables returned. Verbal understanding expressed. Denies SI/HI and A/VH. Pt given opportunity to express concerns and ask questions.  

## 2016-10-18 NOTE — [facility] Suicide Risk Assessment (Signed)
[facility]   Principal Problem: MDD (major depressive disorder), recurrent, severe, with psychosis (HCC) Discharge Diagnoses:  Patient Active Problem List   Diagnosis Date Noted  . MDD (major depressive disorder), recurrent, severe, with psychosis (HCC) [F33.3] 10/13/2016  . GAD (generalized anxiety disorder) [F41.1] 10/13/2016  . Cannabis abuse [F12.10] 10/11/2016    Total Time spent with patient: 30 minutes  Musculoskeletal: Strength & Muscle Tone: within normal limits Gait & Station: normal Patient leans: N/A  Psychiatric Specialty Exam: Review of Systems  Psychiatric/Behavioral: Positive for substance abuse. Negative for depression and suicidal ideas.  All other systems reviewed and are negative.   Blood pressure 123/89, pulse 100, temperature 99 F (37.2 C), temperature source Oral, resp. rate 16, height 5\' 7"  (1.702 m), weight 69.4 kg (153 lb), last menstrual period 09/04/2016, SpO2 100 %.Body mass index is 23.96 kg/m.  General Appearance: Casual  Eye Contact::  Fair  Speech:  Clear and Coherent409  Volume:  Normal  Mood:  Euthymic  Affect:  Appropriate  Thought Process:  Goal Directed and Descriptions of Associations: Intact  Orientation:  Full (Time, Place, and Person)  Thought Content:  Logical  Suicidal Thoughts:  No  Homicidal Thoughts:  No  Memory:  Immediate;   Fair Recent;   Fair Remote;   Fair  Judgement:  Fair  Insight:  Fair  Psychomotor Activity:  Normal  Concentration:  Fair  Recall:  [employer]Fair  Fund of Knowledge:Fair  Language: Fair  Akathisia:  No  Handed:  Right  AIMS (if indicated):   0  Assets:  Communication Skills Desire for Improvement  Sleep:  Number of Hours: 6.5  Cognition: WNL  ADL's:  Intact   Mental Status Per Nursing Assessment::   On Admission:  NA  Demographic Factors:  NA  Loss Factors: NA  Historical Factors: Impulsivity  Risk Reduction Factors:   Positive social support  Continued  Clinical Symptoms:  Alcohol/Substance Abuse/Dependencies  Cognitive Features That Contribute To Risk:  None    Suicide Risk:  Minimal: No identifiable suicidal ideation.  Patients presenting with no risk factors but with morbid ruminations; may be classified as minimal risk based on the severity of the depressive symptoms  Follow-up Information    [facility] Follow up.   Specialty:  Professional Counselor Why:  Please use [facility] to establish for medications management and therapy.  Hours are Mon-Fri from 8:30 - noon.  Arrive early for prompt service as clinic operates on a first come/first served basis.  Bring hospital discharge paperwork with you.  Contact information: [address] [place] [id number] [phone]           Plan Of Care/Follow-up recommendations:  Activity:  no restrictions Diet:  regular Other:  none  Kadience Macchi, MD 10/18/2016, 9:29 AM
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
## 2016-10-18 NOTE — Progress Notes (Signed)
Recreation Therapy Notes  Date: 10/18/16 Time: 1000 Location: 500 Hall Dayroom  Group Topic: Coping Skills  Goal Area(s) Addresses:  Patients will be able to identify positive coping skills. Patients will be able to identify benefits of coping skills. Patients will be able to identify benefits of using coping skills post d/c.  Behavioral Response: Engaged  Intervention:  Magazines, scissors, glue sticks, coping skills worksheet  Activity: My Coping Skills.  Patients were given a worksheet that was divided into 5 categories.  The categories were diversions, social, cognitive, tension releasers and physical.  Patients were to look through the magazines and find pictures of coping skills that fit each category.  Education: PharmacologistCoping Skills, Building control surveyorDischarge Planning.   Education Outcome: Acknowledges understanding/In group clarification offered/Needs additional education.   Clinical Observations/Feedback:  Pt identified some coping skills as "learning from mistakes, eating out, relaxation, money, going on vacation and exercise".  Pt stated using her coping skills will help her "not waste time on things that don't benefit me and focus on myself more.   Caroll RancherMarjette Nandika Stetzer, LRT/CTRS      Caroll RancherLindsay, Izzabell Klasen A 10/18/2016 12:01 PM

## 2016-10-18 NOTE — Progress Notes (Signed)
  [facility] Discharge Plan :  Will you be returning to the same living situation after discharge:  Yes,  home At discharge, do you have transportation home?: Yes,  friend Do you have the ability to pay for your medications: Yes,  mental health  Release of information consent forms completed and in the chart;  Patient's signature needed at discharge.  Patient to Follow up at: Follow-up Information    [facility] Follow up.   Specialty:  Professional Counselor Why:  Use Walk In Clinic on Monday to establish for medications management and therapy.  Hours are Mon-Fri from 8:30 - noon.  Arrive early for prompt service as clinic operates on a first come/first served basis.  Bring hospital discharge paperwork with you.  Contact information: [address] [place] [id number] [phone]           Next level of care provider has access [facility] Link:no  Safety Planning and Suicide Prevention discussed: Yes,  yes  Have you used any form of tobacco in the last 30 days? (Cigarettes, Smokeless Tobacco, Cigars, and/or Pipes): No  Has patient been referred to the Quitline?: N/A patient is not a smoker  Patient has been referred for addiction treatment: Yes  Ida RogueRodney B Linnie Delgrande 10/18/2016, 10:49 AM

## 2017-01-31 ENCOUNTER — Other Ambulatory Visit: Payer: Self-pay | Admitting: Obstetrics and Gynecology

## 2017-01-31 DIAGNOSIS — Z1231 Encounter for screening mammogram for malignant neoplasm of breast: Secondary | ICD-10-CM

## 2017-02-20 ENCOUNTER — Encounter (HOSPITAL_COMMUNITY): Payer: Self-pay

## 2017-02-20 ENCOUNTER — Other Ambulatory Visit: Payer: Self-pay | Admitting: Obstetrics and Gynecology

## 2017-02-20 ENCOUNTER — Ambulatory Visit
Admission: RE | Admit: 2017-02-20 | Discharge: 2017-02-20 | Disposition: A | Discharge status: Home / Self Care | Payer: No Typology Code available for payment source | Source: Ambulatory Visit | Attending: Obstetrics and Gynecology | Admitting: Obstetrics and Gynecology

## 2017-02-20 ENCOUNTER — Ambulatory Visit (HOSPITAL_COMMUNITY)
Admission: RE | Admit: 2017-02-20 | Discharge: 2017-02-20 | Disposition: A | Discharge status: Home / Self Care | Payer: Self-pay | Source: Ambulatory Visit | Attending: Obstetrics and Gynecology | Admitting: Obstetrics and Gynecology

## 2017-02-20 VITALS — BP 118/76 | Temp 98.1°F | Ht 67.5 in | Wt 180.0 lb

## 2017-02-20 DIAGNOSIS — Z1231 Encounter for screening mammogram for malignant neoplasm of breast: Secondary | ICD-10-CM

## 2017-02-20 DIAGNOSIS — Z1239 Encounter for other screening for malignant neoplasm of breast: Secondary | ICD-10-CM

## 2017-02-20 NOTE — Progress Notes (Signed)
No complaints today.   Pap Smear:  Pap smear not completed today. [facility]. Per patient has no history of an abnormal Pap smear. Last Pap smear result is in EPIC.  Physical exam: Breasts Breasts symmetrical. No skin abnormalities bilateral breasts. No nipple retraction bilateral breasts. No nipple discharge bilateral breasts. No lymphadenopathy. No lumps palpated bilateral breasts. No complaints of pain or tenderness on exam. [facility]. Appointment scheduled for Thursday, Feb 20, 2017 at 1600.        Pelvic/Bimanual No Pap smear completed today since last Pap smear was 01/14/2017. Pap smear not indicated per BCCCP guidelines.   Smoking History: Patient has never smoked.  Patient Navigation: Patient education provided. [facility] to services provided for [facility] through [facility].

## 2017-02-20 NOTE — Patient Instructions (Signed)
Explained breast self awareness with Jacqueline Horn. Patient did not need a Pap smear today due to last Pap smear was 01/14/2017. Let her know BCCCP will cover Pap smears every 3 years unless has a history of abnormal Pap smears. [facility]. Appointment scheduled for Thursday, Feb 20, 2017 at 1600. Let patient know the Breast Center will follow up with her within the next couple weeks with results of mammogram by letter or phone. Jacqueline BryantLatrichia T Flock verbalized understanding.  Torianna Junio, Kathaleen Maserhristine Poll, RN 4:15 PM

## 2017-02-21 ENCOUNTER — Encounter (HOSPITAL_COMMUNITY): Payer: Self-pay | Admitting: *Deleted

## 2017-11-09 IMAGING — DX DG LUMBAR SPINE COMPLETE 4+V
5 series · 5 of 5 positions shown · non-contrast
Comparison: None

CLINICAL DATA: Low back pain radiating down LEFT leg for 3 weeks,
repetitive movement work bending down to stop child's

EXAM:
LUMBAR SPINE - COMPLETE 4+ VIEW

[l-spine ap]
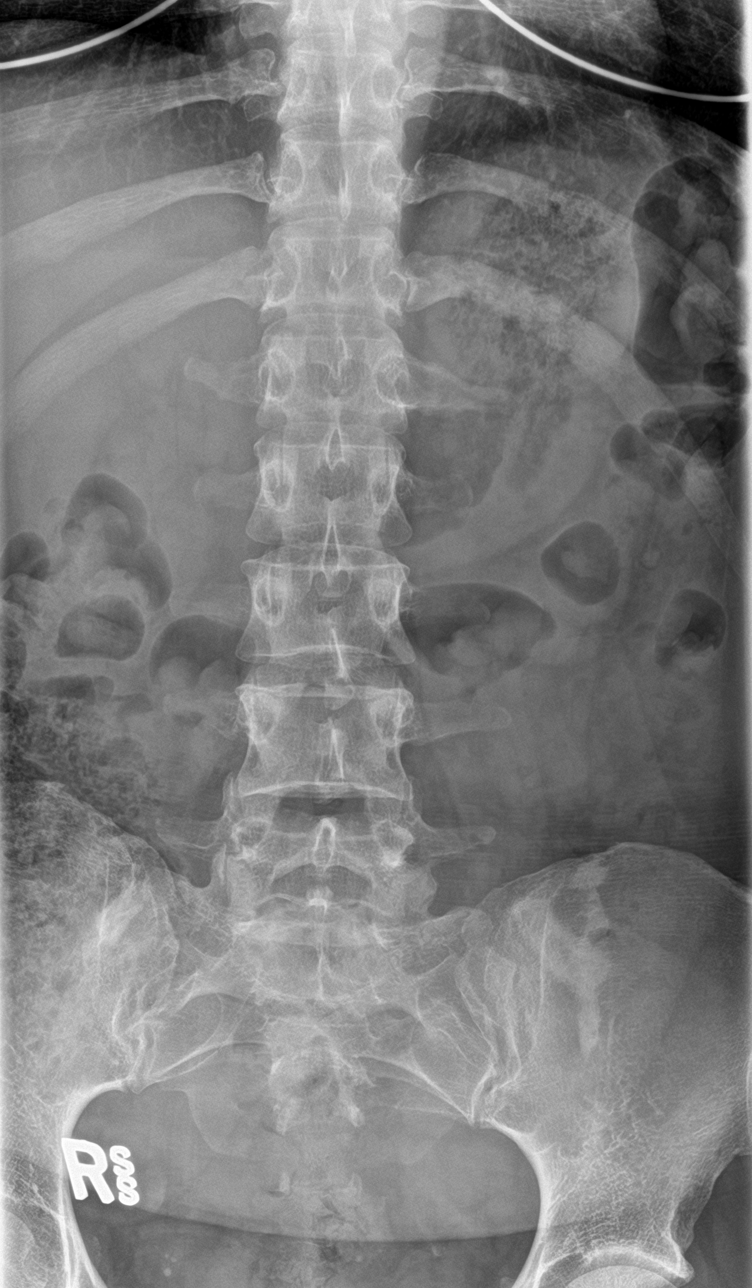

[l-spine obl (1 of 2)]
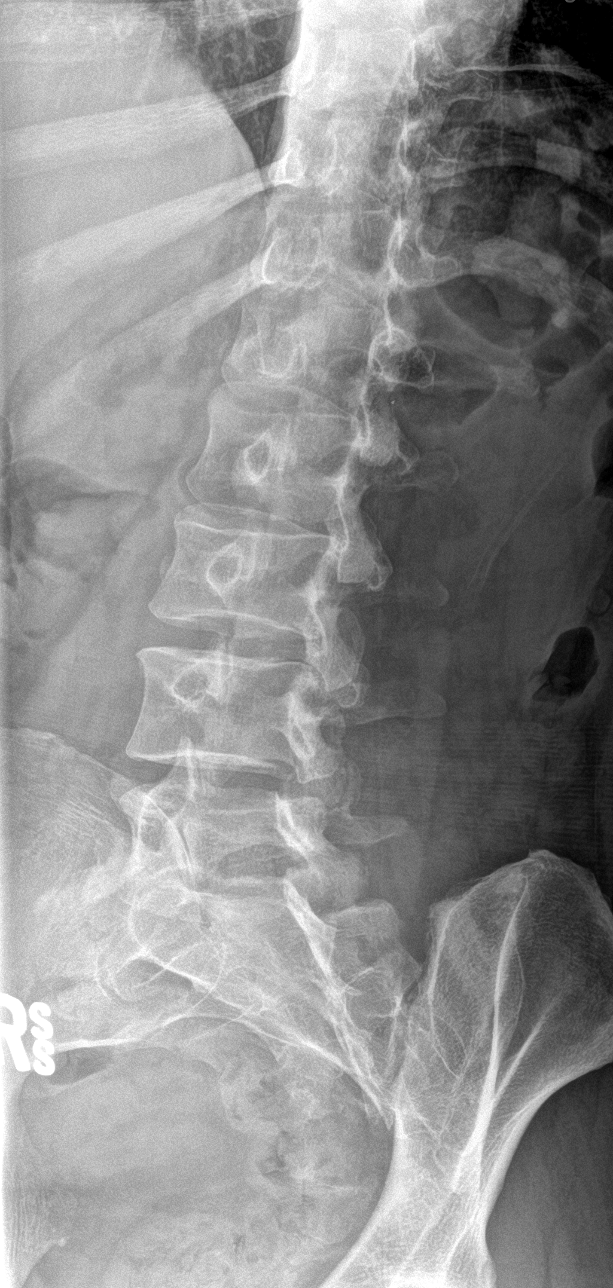

[l-spine obl (2 of 2)]
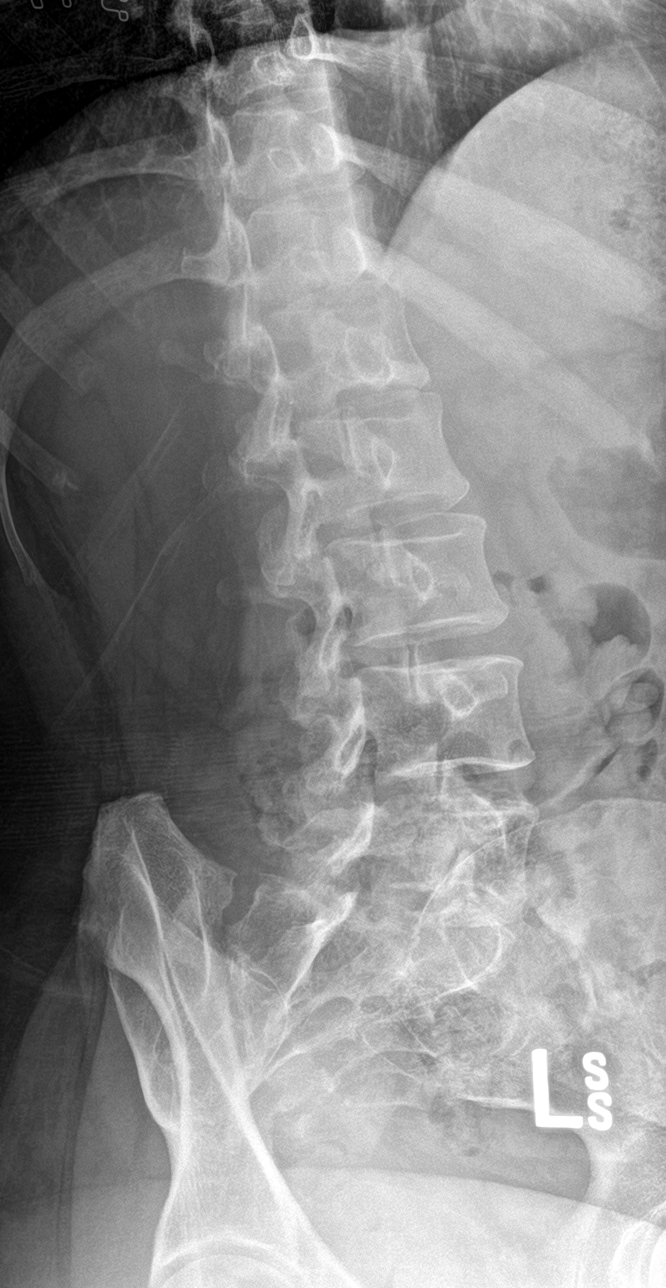

[l-spine lat]
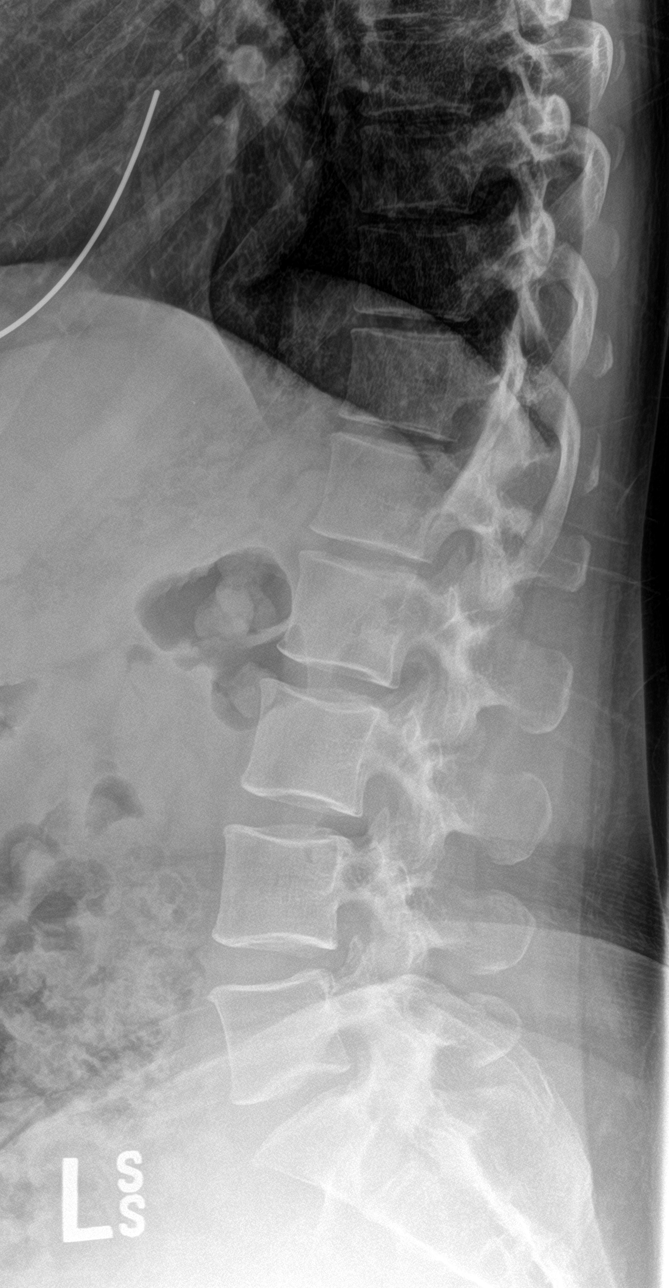

[l-spine spot]
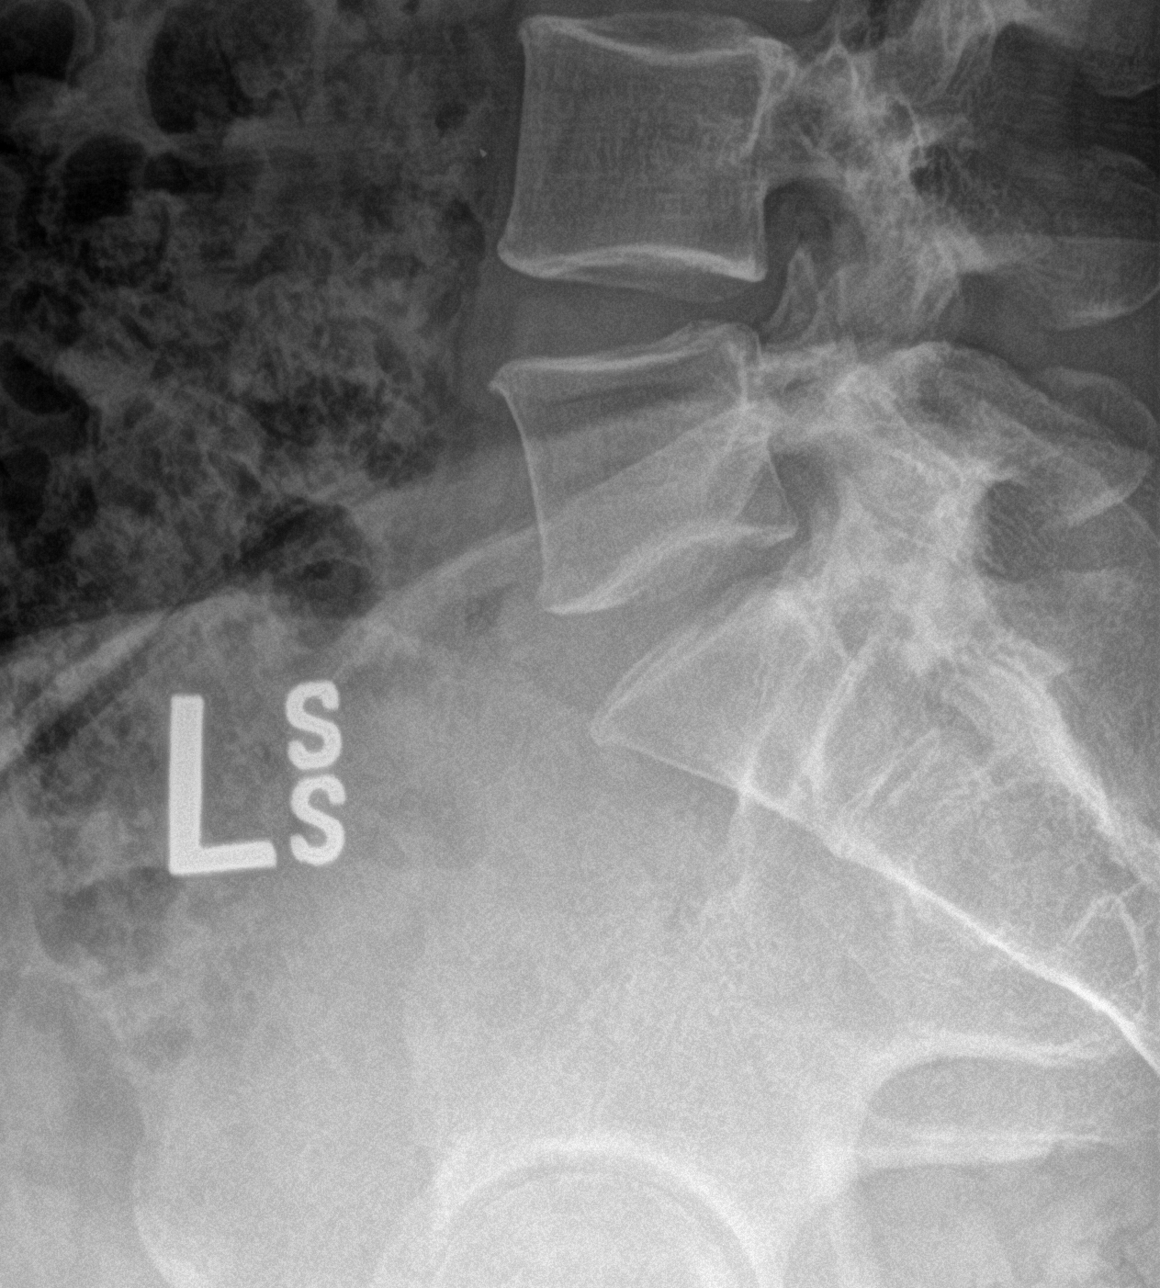

[5 of 5 positions shown; findings below may reference images not displayed]

FINDINGS: Five non-rib-bearing lumbar vertebra.

Bones appear mildly demineralized.

Vertebral body and disc space heights maintained.

No acute fracture, subluxation or bone destruction.

No spondylolysis.

Areas of sclerosis within LEFT iliac bone, small focus on RIGHT as
well, nonspecific and of uncertain etiology.

SI joints symmetric.
IMPRESSION: No acute osseous abnormalities.

Areas of sclerosis within the iliac bones bilaterally, LEFT greater
than RIGHT, of uncertain etiology.

Significance of these is uncertain ; does patient have a history of
prior pelvic trauma?

Correlation with patient history recommended.

If patient has any prior outside exams these would be of benefit in
establishing stability of these findings.

In the absence of prior studies, consider followup radionuclide bone
scan determine if these represent metabolically active sites of
potential pathology.

## 2018-03-03 DIAGNOSIS — S70261A Insect bite (nonvenomous), right hip, initial encounter: Secondary | ICD-10-CM | POA: Insufficient documentation

## 2018-03-03 DIAGNOSIS — Y929 Unspecified place or not applicable: Secondary | ICD-10-CM | POA: Insufficient documentation

## 2018-03-03 DIAGNOSIS — Y939 Activity, unspecified: Secondary | ICD-10-CM | POA: Insufficient documentation

## 2018-03-03 DIAGNOSIS — Z87891 Personal history of nicotine dependence: Secondary | ICD-10-CM | POA: Insufficient documentation

## 2018-03-03 DIAGNOSIS — W57XXXA Bitten or stung by nonvenomous insect and other nonvenomous arthropods, initial encounter: Secondary | ICD-10-CM | POA: Insufficient documentation

## 2018-03-03 DIAGNOSIS — Z79899 Other long term (current) drug therapy: Secondary | ICD-10-CM | POA: Insufficient documentation

## 2018-03-03 DIAGNOSIS — Y999 Unspecified external cause status: Secondary | ICD-10-CM | POA: Insufficient documentation

## 2018-03-04 ENCOUNTER — Encounter (HOSPITAL_COMMUNITY): Payer: Self-pay

## 2018-03-04 ENCOUNTER — Emergency Department (HOSPITAL_COMMUNITY)
Admission: EM | Admit: 2018-03-04 | Discharge: 2018-03-04 | Disposition: A | Discharge status: Home / Self Care | Payer: No Typology Code available for payment source | Attending: Emergency Medicine | Admitting: Emergency Medicine

## 2018-03-04 ENCOUNTER — Emergency Department (HOSPITAL_COMMUNITY)
Admission: EM | Admit: 2018-03-04 | Discharge: 2018-03-05 | Disposition: A | Discharge status: Home / Self Care | Payer: No Typology Code available for payment source | Attending: Emergency Medicine | Admitting: Emergency Medicine

## 2018-03-04 DIAGNOSIS — T50905A Adverse effect of unspecified drugs, medicaments and biological substances, initial encounter: Secondary | ICD-10-CM | POA: Insufficient documentation

## 2018-03-04 DIAGNOSIS — Y658 Other specified misadventures during surgical and medical care: Secondary | ICD-10-CM | POA: Insufficient documentation

## 2018-03-04 DIAGNOSIS — R112 Nausea with vomiting, unspecified: Secondary | ICD-10-CM

## 2018-03-04 DIAGNOSIS — Z79899 Other long term (current) drug therapy: Secondary | ICD-10-CM | POA: Insufficient documentation

## 2018-03-04 DIAGNOSIS — T887XXA Unspecified adverse effect of drug or medicament, initial encounter: Secondary | ICD-10-CM | POA: Insufficient documentation

## 2018-03-04 DIAGNOSIS — R61 Generalized hyperhidrosis: Secondary | ICD-10-CM | POA: Insufficient documentation

## 2018-03-04 DIAGNOSIS — W57XXXA Bitten or stung by nonvenomous insect and other nonvenomous arthropods, initial encounter: Secondary | ICD-10-CM

## 2018-03-04 DIAGNOSIS — R1013 Epigastric pain: Secondary | ICD-10-CM | POA: Insufficient documentation

## 2018-03-04 DIAGNOSIS — Z87891 Personal history of nicotine dependence: Secondary | ICD-10-CM | POA: Insufficient documentation

## 2018-03-04 LAB — COMPREHENSIVE METABOLIC PANEL
ALBUMIN: 4.2 g/dL (ref 3.5–5.0)
ALT: 11 U/L — ABNORMAL LOW (ref 14–54)
ANION GAP: 11 (ref 5–15)
AST: 16 U/L (ref 15–41)
Alkaline Phosphatase: 78 U/L (ref 38–126)
BILIRUBIN TOTAL: 0.4 mg/dL (ref 0.3–1.2)
BUN: 8 mg/dL (ref 6–20)
CALCIUM: 9.5 mg/dL (ref 8.9–10.3)
CO2: 22 mmol/L (ref 22–32)
CREATININE: 0.68 mg/dL (ref 0.44–1.00)
Chloride: 107 mmol/L (ref 101–111)
GFR calc Af Amer: >60 mL/min (ref >60)
GFR calc non Af Amer: >60 mL/min (ref >60)
GLUCOSE: 123 mg/dL — AB (ref 65–99)
Potassium: 3.5 mmol/L (ref 3.5–5.1)
Sodium: 140 mmol/L (ref 135–145)
TOTAL PROTEIN: 8.4 g/dL — AB (ref 6.5–8.1)

## 2018-03-04 LAB — I-STAT BETA HCG BLOOD, ED (MC, WL, AP ONLY)

## 2018-03-04 LAB — CBC
HCT: 39.8 % (ref 36.0–46.0)
Hemoglobin: 13.5 g/dL (ref 12.0–15.0)
MCH: 27.9 pg (ref 26.0–34.0)
MCHC: 33.9 g/dL (ref 30.0–36.0)
MCV: 82.2 fL (ref 78.0–100.0)
PLATELETS: 427 10*3/uL — AB (ref 150–400)
RBC: 4.84 MIL/uL (ref 3.87–5.11)
RDW: 16 % — AB (ref 11.5–15.5)
WBC: 10.8 10*3/uL — ABNORMAL HIGH (ref 4.0–10.5)

## 2018-03-04 LAB — LIPASE, BLOOD: Lipase: 33 U/L (ref 11–51)

## 2018-03-04 MED ORDER — HYDROCORTISONE 1 % EX CREA
TOPICAL_CREAM | Freq: Two times a day (BID) | CUTANEOUS | Status: DC
  Administered 2018-03-04: 05:00:00 via TOPICAL
  Filled 2018-03-04: qty 28

## 2018-03-04 MED ORDER — ONDANSETRON HCL 4 MG/2ML IJ SOLN
4.0000 mg | Freq: Once | INTRAMUSCULAR | Status: AC
  Administered 2018-03-05: 4 mg via INTRAVENOUS
  Filled 2018-03-04: qty 2

## 2018-03-04 MED ORDER — SODIUM CHLORIDE 0.9 % IV BOLUS
1000.0000 mL | Freq: Once | INTRAVENOUS | Status: AC
  Administered 2018-03-05: 1000 mL via INTRAVENOUS

## 2018-03-04 MED ORDER — DOXYCYCLINE HYCLATE 100 MG PO TABS
200.0000 mg | ORAL_TABLET | Freq: Once | ORAL | Status: AC
  Administered 2018-03-04: 200 mg via ORAL
  Filled 2018-03-04: qty 2

## 2018-03-04 NOTE — ED Triage Notes (Signed)
Pt was seen and treated for a tick bite last night, she was given doxycycline while she was here, she said she felt great when she left here and about 30 minutes after getting home she started vomiting, pt also complains of severe stomach pains all day today and several episodes of vomiting

## 2018-03-04 NOTE — ED Triage Notes (Signed)
Pt states that she pulled a tick off her right hip yesterday and now the area is swollen, red and itches

## 2018-03-04 NOTE — ED Provider Notes (Signed)
[facility]-EMERGENCY DEPT Provider Note: Lowella Dell, MD, FACEP  CSN: [id number] MRN: [id number] ARRIVAL: 03/03/18 at 2241 [facility]   CHIEF COMPLAINT  Insect Bite   HISTORY OF PRESENT ILLNESS  03/04/18 4:43 AM Jacqueline Horn is a 42 y.o. female who was bitten by a tick on her right hip yesterday.  There is a welt there now.  It is moderately itchy and mildly painful.  She did see the tick and pulled it off herself.   History reviewed. No pertinent past medical history.  Past Surgical History:  Procedure Laterality Date  . AUGMENTATION MAMMAPLASTY Bilateral 2008   saline  . BREAST ENHANCEMENT SURGERY Bilateral   . CESAREAN SECTION      Family History  Problem Relation Age of Onset  . Diabetes Mother   . Hypertension Mother   . Diabetes Maternal Grandfather     Social History   Tobacco Use  . Smoking status: Former Smoker    Types: Cigarettes  . Smokeless tobacco: Never Used  Substance Use Topics  . Alcohol use: No  . Drug use: No    Types: Marijuana    Comment: quit in December 2017    Prior to Admission medications   Medication Sig Start Date End Date Taking? Authorizing Provider  gabapentin (NEURONTIN) 300 MG capsule Take 300 mg by mouth at bedtime.    [provider]  hydrocerin (EUCERIN) CREA Apply 1 application topically 2 (two) times daily. Patient not taking: Reported on 02/20/2017 10/18/16   Truman Hayward, FNP  hydrOXYzine (ATARAX/VISTARIL) 25 MG tablet Take 1 tablet (25 mg total) by mouth 3 (three) times daily as needed for anxiety. Patient not taking: Reported on 02/20/2017 10/18/16   Truman Hayward, FNP  lidocaine (LIDODERM) 5 % Place 1 patch onto the skin daily. Remove & Discard patch within 12 hours or as directed by MD Patient not taking: Reported on 10/10/2016 09/04/16   Trixie Dredge, PA-C  meloxicam (MOBIC) 7.5 MG tablet Take 1 tablet (7.5 mg total) by mouth daily. 09/04/16   Trixie Dredge, PA-C  methocarbamol (ROBAXIN) 500 MG  tablet Take 1 tablet (500 mg total) by mouth 2 (two) times daily. Patient not taking: Reported on 10/10/2016 09/25/16   Barrett Henle, PA-C  QUEtiapine (SEROQUEL) 50 MG tablet Take 2.5 tablets (125 mg total) by mouth at bedtime. Patient taking differently: Take 50 mg by mouth at bedtime.  10/18/16   Truman Hayward, FNP  sertraline (ZOLOFT) 50 MG tablet Take 1 tablet (50 mg total) by mouth daily. Patient not taking: Reported on 02/20/2017 10/19/16   Truman Hayward, FNP    Allergies Chocolate and Latex   REVIEW OF SYSTEMS  Negative except as noted here or in the History of Present Illness.   PHYSICAL EXAMINATION  Initial Vital Signs Blood pressure 135/89, pulse 71, temperature 98.9 F (37.2 C), temperature source Oral, resp. rate 15, height  (1.702 m), weight 73.1 kg (161 lb 1.6 oz), last menstrual period 02/10/2018, SpO2 100 %.  Examination General: Well-developed, well-nourished female in no acute distress; appearance consistent with age of record HENT: normocephalic; atraumatic Eyes: Normal appearance Neck: supple Heart: regular rate and rhythm Lungs: clear to auscultation bilaterally Abdomen: soft; nondistended; nontender; bowel sounds present Extremities: No deformity; full range of motion Neurologic: Awake, alert and oriented; motor function intact in all extremities and symmetric; no facial droop Skin: Warm and dry; 6 cm diameter welt right hip with mild warmth Psychiatric: Normal mood and affect  RESULTS  Summary of this visit's results, reviewed by myself:   EKG Interpretation  Date/Time:    Ventricular Rate:    PR Interval:    QRS Duration:   QT Interval:    QTC Calculation:   R Axis:     Text Interpretation:        Laboratory Studies: No results found for this or any previous visit (from the past 24 hour(s)). Imaging Studies: No results found.  ED COURSE and MDM  Nursing notes and initial vitals signs, including pulse oximetry,  reviewed.  Vitals:   03/04/18 0006 03/04/18 0340  BP: 128/90 135/89  Pulse: 66 71  Resp: 14 15  Temp: 98.9 F (37.2 C)   TempSrc: Oral   SpO2: 100% 100%  Weight: 73.1 kg (161 lb 1.6 oz)   Height:  (1.702 m)    We will treat with 200 mg of doxycycline as prophylaxis against [facility] spotted fever.  We will also provide hydrocortisone cream for local reaction.  She was advised she may take over-the-counter antihistamines as needed for itching and local swelling.  PROCEDURES    ED DIAGNOSES     ICD-10-CM   1. Tick bite, initial encounter W57.Neldon Newport, MD 03/04/18 [id number]

## 2018-03-05 LAB — URINALYSIS, ROUTINE W REFLEX MICROSCOPIC
BILIRUBIN URINE: NEGATIVE
GLUCOSE, UA: NEGATIVE mg/dL
KETONES UR: 80 mg/dL — AB
Leukocytes, UA: NEGATIVE
NITRITE: NEGATIVE
PH: 6 (ref 5.0–8.0)
PROTEIN: 100 mg/dL — AB
Specific Gravity, Urine: 1.027 (ref 1.005–1.030)

## 2018-03-05 MED ORDER — SUCRALFATE 1 GM/10ML PO SUSP: 1.0000 g | Freq: Three times a day (TID) | ORAL | 0 refills | Status: AC

## 2018-03-05 MED ORDER — ONDANSETRON 4 MG PO TBDP: 4.0000 mg | ORAL_TABLET | Freq: Three times a day (TID) | ORAL | 0 refills | Status: AC | PRN

## 2018-03-05 MED ORDER — GI COCKTAIL ~~LOC~~
30.0000 mL | Freq: Once | ORAL | Status: AC
  Administered 2018-03-05: 30 mL via ORAL
  Filled 2018-03-05: qty 30

## 2018-03-05 NOTE — Discharge Instructions (Addendum)
Take Zofran for nausea as needed, and take Carafate as directed for the next 2-3 days. Return here with any worsening symptoms - high fever, severe pain, uncontrolled vomiting. Follow up with your doctor for recheck in 2-3 days if symptoms persist.

## 2018-03-05 NOTE — ED Notes (Signed)
Pt is c/o chest wall pain that started after the vomiting. It increases in pain with movement and inspiration and vomiting.

## 2018-03-05 NOTE — ED Provider Notes (Signed)
[facility]-EMERGENCY DEPT Provider Note   CSN: [id number] Arrival date & time: 03/04/18  1858     History   Chief Complaint Chief Complaint  Patient presents with  . Emesis    HPI Jacqueline Horn is a 42 y.o. female.  Patient presents with complaint of epigastric abdominal pain, nausea and vomiting that started this morning. She was seen in the ED for evaluation of painful tick bite on right thigh and given prophylactic single dose Doxycycline and symptoms of vomiting started shortly afterward. The epigastric pain started after the vomiting and is associated with vomiting episodes. She reports a temperature of 99 at home, sweating and persistent nausea. No diarrhea. She has been unable to tolerate anything by mouth since early this morning.    Emesis   Associated symptoms include abdominal pain. Pertinent negatives include no diarrhea, no fever and no myalgias.    History reviewed. No pertinent past medical history.  Patient Active Problem List   Diagnosis Date Noted  . MDD (major depressive disorder), recurrent, severe, with psychosis (HCC) 10/13/2016  . GAD (generalized anxiety disorder) 10/13/2016  . Cannabis abuse 10/11/2016    Past Surgical History:  Procedure Laterality Date  . AUGMENTATION MAMMAPLASTY Bilateral 2008   saline  . BREAST ENHANCEMENT SURGERY Bilateral   . CESAREAN SECTION       OB History    Gravida  5   Para  3   Term  1   Preterm      AB  2   Living  1     SAB  2   TAB      Ectopic      Multiple      Live Births  1            Home Medications    Prior to Admission medications   Medication Sig Start Date End Date Taking? Authorizing Provider  gabapentin (NEURONTIN) 300 MG capsule Take 300 mg by mouth at bedtime as needed (pain).    Yes [provider]  meloxicam (MOBIC) 7.5 MG tablet Take 1 tablet (7.5 mg total) by mouth daily. Patient taking differently: Take 7.5 mg by mouth daily as  needed for pain.  09/04/16  Yes West, Emily, PA-C  QUEtiapine (SEROQUEL) 50 MG tablet Take 2.5 tablets (125 mg total) by mouth at bedtime. Patient taking differently: Take 25 mg by mouth daily as needed (sleep).  10/18/16  Yes Starkes, Juel Burrow, FNP    Family History Family History  Problem Relation Age of Onset  . Diabetes Mother   . Hypertension Mother   . Diabetes Maternal Grandfather     Social History Social History   Tobacco Use  . Smoking status: Former Smoker    Types: Cigarettes  . Smokeless tobacco: Never Used  Substance Use Topics  . Alcohol use: No  . Drug use: No    Types: Marijuana    Comment: quit in December 2017     Allergies   Chocolate and Latex   Review of Systems Review of Systems  Constitutional: Positive for diaphoresis. Negative for fever.  Respiratory: Negative for shortness of breath.   Cardiovascular: Negative for chest pain.  Gastrointestinal: Positive for abdominal pain, nausea and vomiting. Negative for diarrhea.  Genitourinary: Negative for decreased urine volume.  Musculoskeletal: Negative for myalgias.  Neurological: Negative for weakness.     Physical Exam Updated Vital Signs BP (!) 141/66 (BP Location: Right Arm)   Pulse 63   Temp  99.1 F (37.3 C) (Oral)   Resp 16   LMP 02/10/2018   SpO2 100%   Physical Exam  Constitutional: She is oriented to person, place, and time. She appears well-developed and well-nourished. No distress.  HENT:  Head: Normocephalic.  Mouth/Throat: Mucous membranes are dry.  Eyes: Conjunctivae are normal.  Neck: Normal range of motion. Neck supple.  Cardiovascular: Normal rate and regular rhythm.  No murmur heard. Pulmonary/Chest: Effort normal. She has no wheezes. She has no rales.  Abdominal: Soft. She exhibits no distension and no mass. Bowel sounds are increased. There is tenderness.  Epigastric tenderness.  Musculoskeletal: Normal range of motion.  Neurological: She is alert and oriented to  person, place, and time.  Skin: Skin is warm and dry.     ED Treatments / Results  Labs (all labs ordered are listed, but only abnormal results are displayed) Labs Reviewed  COMPREHENSIVE METABOLIC PANEL - Abnormal; Notable for the following components:      Result Value   Glucose, Bld 123 (*)    Total Protein 8.4 (*)    ALT 11 (*)    All other components within normal limits  CBC - Abnormal; Notable for the following components:   WBC 10.8 (*)    RDW 16.0 (*)    Platelets 427 (*)    All other components within normal limits  URINALYSIS, ROUTINE W REFLEX MICROSCOPIC - Abnormal; Notable for the following components:   APPearance HAZY (*)    Hgb urine dipstick LARGE (*)    Ketones, ur 80 (*)    Protein, ur 100 (*)    RBC / HPF >50 (*)    Bacteria, UA RARE (*)    All other components within normal limits  LIPASE, BLOOD  I-STAT BETA HCG BLOOD, ED ([facility], [facility], [facility] ONLY)    EKG None  Radiology No results found.  Procedures Procedures (including critical care time)  Medications Ordered in ED Medications  sodium chloride 0.9 % bolus 1,000 mL (1,000 mLs Intravenous New Bag/Given 03/05/18 0018)  ondansetron (ZOFRAN) injection 4 mg (4 mg Intravenous Given 03/05/18 0019)     Initial Impression / Assessment and Plan / ED Course  I have reviewed the triage vital signs and the nursing notes.  Pertinent labs & imaging results that were available during my care of the patient were reviewed by me and considered in my medical decision making (see chart for details).     Patient returns to the ED with complaint of epigastric pain and N/V all day today. Symptoms started 30 minutes after taking Doxycycline for tick bite prophylaxis. No fever. No hematemesis.   IVF's with improvement. Epigastric pain continues. GI cocktail ordered. PO challenge ordered.   GI cocktail helps relieve the epigastric pain. No further vomiting. She is drinking PO's without difficulty. Feel she is appropriate  for discharge home. Likely GI upset from doxycycline dosing this morning. No fever, change to or worsening of symptoms of right thigh treated as tick bite.   Will discharge home with return precautions.   Final Clinical Impressions(s) / ED Diagnoses   Final diagnoses:  None   1. Epigastric pain 2. Nausea and vomiting 3. Adverse medication reaction  ED Discharge Orders    None       Elpidio Anis, PA-C 03/05/18 1610    Gilda Crease, MD 03/05/18 [phone]
